# Patient Record
Sex: Male | Born: 1940 | Race: White | Hispanic: No | Marital: Married | Smoking: Never smoker
Health system: Southern US, Community
[De-identification: ages and names within clinical notes are randomized; demographics above are authoritative.]

## PROBLEM LIST (undated history)

## (undated) DIAGNOSIS — E785 Hyperlipidemia, unspecified: Secondary | ICD-10-CM

## (undated) DIAGNOSIS — I2510 Atherosclerotic heart disease of native coronary artery without angina pectoris: Secondary | ICD-10-CM

## (undated) DIAGNOSIS — I10 Essential (primary) hypertension: Secondary | ICD-10-CM

## (undated) DIAGNOSIS — Z7901 Long term (current) use of anticoagulants: Secondary | ICD-10-CM

## (undated) DIAGNOSIS — I4891 Unspecified atrial fibrillation: Secondary | ICD-10-CM

## (undated) DIAGNOSIS — E538 Deficiency of other specified B group vitamins: Secondary | ICD-10-CM

## (undated) HISTORY — PX: CORONARY ARTERY BYPASS GRAFT: SHX141

## (undated) HISTORY — PX: ATRIAL FIBRILLATION ABLATION: SHX5732

## (undated) HISTORY — PX: HERNIA REPAIR: SHX51

## (undated) HISTORY — PX: CARDIAC CATHETERIZATION: SHX172

## (undated) HISTORY — PX: CARDIAC SURGERY: SHX584

---

## 1999-10-02 DIAGNOSIS — Z951 Presence of aortocoronary bypass graft: Secondary | ICD-10-CM

## 1999-10-02 HISTORY — DX: Presence of aortocoronary bypass graft: Z95.1

## 2008-11-02 ENCOUNTER — Ambulatory Visit: Payer: Self-pay | Admitting: Surgery

## 2009-04-14 ENCOUNTER — Ambulatory Visit: Payer: Self-pay | Admitting: Unknown Physician Specialty

## 2016-12-16 ENCOUNTER — Emergency Department: Admission: EM | Admit: 2016-12-16 | Payer: Self-pay | Source: Home / Self Care

## 2017-09-06 DIAGNOSIS — I4891 Unspecified atrial fibrillation: Secondary | ICD-10-CM | POA: Insufficient documentation

## 2017-09-06 DIAGNOSIS — I10 Essential (primary) hypertension: Secondary | ICD-10-CM | POA: Insufficient documentation

## 2017-09-06 DIAGNOSIS — T50901A Poisoning by unspecified drugs, medicaments and biological substances, accidental (unintentional), initial encounter: Secondary | ICD-10-CM | POA: Diagnosis present

## 2017-09-06 DIAGNOSIS — T45511A Poisoning by anticoagulants, accidental (unintentional), initial encounter: Secondary | ICD-10-CM | POA: Insufficient documentation

## 2017-09-06 DIAGNOSIS — Z7901 Long term (current) use of anticoagulants: Secondary | ICD-10-CM | POA: Diagnosis not present

## 2017-09-06 DIAGNOSIS — Z951 Presence of aortocoronary bypass graft: Secondary | ICD-10-CM | POA: Diagnosis not present

## 2017-09-06 DIAGNOSIS — E785 Hyperlipidemia, unspecified: Secondary | ICD-10-CM | POA: Diagnosis not present

## 2017-09-06 DIAGNOSIS — T462X1A Poisoning by other antidysrhythmic drugs, accidental (unintentional), initial encounter: Principal | ICD-10-CM | POA: Insufficient documentation

## 2017-09-06 DIAGNOSIS — Z79899 Other long term (current) drug therapy: Secondary | ICD-10-CM | POA: Diagnosis not present

## 2017-09-06 DIAGNOSIS — I2510 Atherosclerotic heart disease of native coronary artery without angina pectoris: Secondary | ICD-10-CM | POA: Insufficient documentation

## 2017-09-06 DIAGNOSIS — I491 Atrial premature depolarization: Secondary | ICD-10-CM | POA: Insufficient documentation

## 2017-09-07 ENCOUNTER — Other Ambulatory Visit: Payer: Self-pay

## 2017-09-07 ENCOUNTER — Observation Stay
Admission: EM | Admit: 2017-09-07 | Discharge: 2017-09-07 | Disposition: A | Discharge status: Home / Self Care | Payer: Medicare Other | Attending: Internal Medicine | Admitting: Internal Medicine

## 2017-09-07 ENCOUNTER — Encounter: Payer: Self-pay | Admitting: Emergency Medicine

## 2017-09-07 DIAGNOSIS — T462X1A Poisoning by other antidysrhythmic drugs, accidental (unintentional), initial encounter: Secondary | ICD-10-CM | POA: Diagnosis not present

## 2017-09-07 DIAGNOSIS — T50901A Poisoning by unspecified drugs, medicaments and biological substances, accidental (unintentional), initial encounter: Secondary | ICD-10-CM | POA: Diagnosis present

## 2017-09-07 HISTORY — DX: Unspecified atrial fibrillation: I48.91

## 2017-09-07 LAB — CBC
HCT: 42 % (ref 40.0–52.0)
HEMATOCRIT: 38.2 % — AB (ref 40.0–52.0)
Hemoglobin: 13.1 g/dL (ref 13.0–18.0)
Hemoglobin: 14.4 g/dL (ref 13.0–18.0)
MCH: 32 pg (ref 26.0–34.0)
MCH: 32 pg (ref 26.0–34.0)
MCHC: 34.3 g/dL (ref 32.0–36.0)
MCHC: 34.4 g/dL (ref 32.0–36.0)
MCV: 92.8 fL (ref 80.0–100.0)
MCV: 93.4 fL (ref 80.0–100.0)
PLATELETS: 232 10*3/uL (ref 150–440)
PLATELETS: 243 10*3/uL (ref 150–440)
RBC: 4.11 MIL/uL — AB (ref 4.40–5.90)
RBC: 4.5 MIL/uL (ref 4.40–5.90)
RDW: 14 % (ref 11.5–14.5)
RDW: 14.1 % (ref 11.5–14.5)
WBC: 6.8 10*3/uL (ref 3.8–10.6)
WBC: 7 10*3/uL (ref 3.8–10.6)

## 2017-09-07 LAB — COMPREHENSIVE METABOLIC PANEL
ALBUMIN: 4.2 g/dL (ref 3.5–5.0)
ALT: 38 U/L (ref 17–63)
ANION GAP: 10 (ref 5–15)
AST: 40 U/L (ref 15–41)
Alkaline Phosphatase: 65 U/L (ref 38–126)
BUN: 21 mg/dL — AB (ref 6–20)
CHLORIDE: 103 mmol/L (ref 101–111)
CO2: 25 mmol/L (ref 22–32)
Calcium: 9.1 mg/dL (ref 8.9–10.3)
Creatinine, Ser: 0.95 mg/dL (ref 0.61–1.24)
GFR calc Af Amer: >60 mL/min (ref >60)
Glucose, Bld: 136 mg/dL — ABNORMAL HIGH (ref 65–99)
POTASSIUM: 3.5 mmol/L (ref 3.5–5.1)
Sodium: 138 mmol/L (ref 135–145)
Total Bilirubin: 0.8 mg/dL (ref 0.3–1.2)
Total Protein: 7.7 g/dL (ref 6.5–8.1)

## 2017-09-07 LAB — MAGNESIUM: MAGNESIUM: 2.1 mg/dL (ref 1.7–2.4)

## 2017-09-07 LAB — BASIC METABOLIC PANEL
Anion gap: 9 (ref 5–15)
BUN: 18 mg/dL (ref 6–20)
CHLORIDE: 106 mmol/L (ref 101–111)
CO2: 23 mmol/L (ref 22–32)
Calcium: 8.4 mg/dL — ABNORMAL LOW (ref 8.9–10.3)
Creatinine, Ser: 0.8 mg/dL (ref 0.61–1.24)
GFR calc non Af Amer: >60 mL/min (ref >60)
Glucose, Bld: 103 mg/dL — ABNORMAL HIGH (ref 65–99)
Potassium: 4 mmol/L (ref 3.5–5.1)
SODIUM: 138 mmol/L (ref 135–145)

## 2017-09-07 LAB — TROPONIN I: Troponin I: <0.03 ng/mL (ref <0.03)

## 2017-09-07 MED ORDER — SODIUM CHLORIDE 0.9% FLUSH
3.0000 mL | Freq: Two times a day (BID) | INTRAVENOUS | Status: DC
  Administered 2017-09-07: 3 mL via INTRAVENOUS

## 2017-09-07 MED ORDER — ONDANSETRON HCL 4 MG/2ML IJ SOLN: 4.0000 mg | Freq: Four times a day (QID) | INTRAMUSCULAR | Status: DC | PRN

## 2017-09-07 MED ORDER — RAMIPRIL 10 MG PO CAPS
10.0000 mg | ORAL_CAPSULE | Freq: Every day | ORAL | Status: DC
  Filled 2017-09-07: qty 1

## 2017-09-07 MED ORDER — LORATADINE 10 MG PO TABS
10.0000 mg | ORAL_TABLET | Freq: Every day | ORAL | Status: DC
Start: 2017-09-07 — End: 2017-09-07
  Filled 2017-09-07: qty 1

## 2017-09-07 MED ORDER — SENNOSIDES-DOCUSATE SODIUM 8.6-50 MG PO TABS: 1.0000 | ORAL_TABLET | Freq: Every evening | ORAL | Status: DC | PRN

## 2017-09-07 MED ORDER — SODIUM CHLORIDE 0.9 % IV SOLN: 250.0000 mL | INTRAVENOUS | Status: DC | PRN

## 2017-09-07 MED ORDER — ACETAMINOPHEN 650 MG RE SUPP
650.0000 mg | Freq: Four times a day (QID) | RECTAL | Status: DC | PRN
Start: 2017-09-07 — End: 2017-09-07

## 2017-09-07 MED ORDER — VITAMIN B-12 1000 MCG PO TABS
1000.0000 ug | ORAL_TABLET | Freq: Every day | ORAL | Status: DC
  Administered 2017-09-07: 1000 ug via ORAL
  Filled 2017-09-07: qty 1

## 2017-09-07 MED ORDER — METOPROLOL SUCCINATE ER 100 MG PO TB24
100.0000 mg | ORAL_TABLET | Freq: Two times a day (BID) | ORAL | Status: DC
  Filled 2017-09-07: qty 1

## 2017-09-07 MED ORDER — ATORVASTATIN CALCIUM 20 MG PO TABS: 80.0000 mg | ORAL_TABLET | Freq: Every day | ORAL | Status: DC

## 2017-09-07 MED ORDER — APIXABAN 5 MG PO TABS: 5.0000 mg | ORAL_TABLET | Freq: Two times a day (BID) | ORAL | Status: DC

## 2017-09-07 MED ORDER — ONDANSETRON HCL 4 MG PO TABS: 4.0000 mg | ORAL_TABLET | Freq: Four times a day (QID) | ORAL | Status: DC | PRN

## 2017-09-07 MED ORDER — AMLODIPINE BESYLATE 5 MG PO TABS
5.0000 mg | ORAL_TABLET | Freq: Every day | ORAL | Status: DC
  Filled 2017-09-07: qty 1

## 2017-09-07 MED ORDER — ACETAMINOPHEN 325 MG PO TABS: 650.0000 mg | ORAL_TABLET | Freq: Four times a day (QID) | ORAL | Status: DC | PRN

## 2017-09-07 MED ORDER — SODIUM CHLORIDE 0.9% FLUSH: 3.0000 mL | INTRAVENOUS | Status: DC | PRN

## 2017-09-07 NOTE — ED Triage Notes (Signed)
Pt states took too much of his eliquis and dofetilide. Pt states has had 20mg  of eliquis and 1017mcg of dofetilide since 2100. Overdose was unintentional.

## 2017-09-07 NOTE — H&P (Signed)
[facility] [facility] [place] NAME: Kerry Graham    MR#:  [id number]  DATE OF BIRTH:  1941-03-04  DATE OF ADMISSION:  09/07/2017  PRIMARY CARE PHYSICIAN: Rusty Aus, MD   REQUESTING/REFERRING PHYSICIAN:   CHIEF COMPLAINT:   Chief Complaint  Patient presents with  . Ingestion    HISTORY OF PRESENT ILLNESS: Kerry Graham  is a 76 y.o. male with a known history of atrial fibrillation presented to the emergency room after he unintentionally took an extra dose of his heart medication. Patient took an extra pill of tikosyn and eliquis last night around 9 PM. After 15 minutes after taking the medication he tried to vomit but did not see anything come up. He looked up for information on the Internet and was concerned about the prolongation of QTC intervals and sudden cardiac death and hence decided to come to the hospital for evaluation. No complaints of any chest pain, palpitations. No complaints of any shortness of breath. Patient has an electrophysiologist at [facility] control was contacted by ER physician who recommended 12 hour observation has half life for tikosyn is 10 hours.   PAST MEDICAL HISTORY:   Past Medical History:  Diagnosis Date  . Atrial fibrillation ([place])   Hyperlipidemia Hypertension CAD  PAST SURGICAL HISTORY:  Past Surgical History:  Procedure Laterality Date  . CARDIAC SURGERY    . CORONARY ARTERY BYPASS GRAFT    . HERNIA REPAIR      SOCIAL HISTORY:  Social History   Tobacco Use  . Smoking status: Never Smoker  . Smokeless tobacco: Never Used  Substance Use Topics  . Alcohol use: No    Frequency: Never    FAMILY HISTORY: History reviewed. No pertinent family history.  DRUG ALLERGIES: No Known Allergies  REVIEW OF SYSTEMS:   CONSTITUTIONAL: No fever, fatigue or weakness.  EYES: No blurred or double vision.  EARS, NOSE, AND THROAT: No tinnitus or ear pain.  RESPIRATORY: No cough, shortness  of breath, wheezing or hemoptysis.  CARDIOVASCULAR: No chest pain, orthopnea, edema.  GASTROINTESTINAL: No nausea, vomiting, diarrhea or abdominal pain.  GENITOURINARY: No dysuria, hematuria.  ENDOCRINE: No polyuria, nocturia,  HEMATOLOGY: No anemia, easy bruising or bleeding SKIN: No rash or lesion. MUSCULOSKELETAL: No joint pain or arthritis.   NEUROLOGIC: No tingling, numbness, weakness.  PSYCHIATRY: No anxiety or depression.   MEDICATIONS AT HOME:  Prior to Admission medications   Medication Sig Start Date End Date Taking? Authorizing Provider  amLODipine (NORVASC) 5 MG tablet Take 5 mg by mouth daily. 09/03/17  Yes [provider]  atorvastatin (LIPITOR) 80 MG tablet Take 80 mg by mouth daily. 06/11/17  Yes [provider]  cetirizine (ZYRTEC) 10 MG tablet Take 10 mg by mouth daily.   Yes [provider]  diltiazem (CARDIZEM) 30 MG tablet Take 30 mg by mouth every 6 (six) hours as needed. 08/11/17 08/11/18 Yes [provider]  dofetilide (TIKOSYN) 500 MCG capsule Take 500 mcg by mouth 2 (two) times daily. 08/12/17  Yes [provider]  ELIQUIS 5 MG TABS tablet Take 5 mg by mouth 2 (two) times daily. 06/12/17  Yes [provider]  meclizine (ANTIVERT) 25 MG tablet Take 25 mg by mouth 3 (three) times daily as needed.   Yes [provider]  metoprolol succinate (TOPROL-XL) 100 MG 24 hr tablet Take 100 mg by mouth 2 (two) times daily. 07/11/17  Yes [provider]  ramipril ([place])  10 MG capsule Take 10 mg by mouth daily. 06/11/17  Yes [provider]  vitamin B-12 (CYANOCOBALAMIN) 1000 MCG tablet Take 1,000 mcg by mouth daily.   Yes [provider]      PHYSICAL EXAMINATION:   VITAL SIGNS: Blood pressure 112/64, pulse (!) 56, temperature 98.1 F (36.7 C), temperature source Oral, resp. rate 15, height 5\' 7"  (1.702 m), weight 78 kg (172 lb), SpO2 95 %.  GENERAL:  77 y.o.-year-old patient lying in  the bed with no acute distress.  EYES: Pupils equal, round, reactive to light and accommodation. No scleral icterus. Extraocular muscles intact.  HEENT: Head atraumatic, normocephalic. Oropharynx and nasopharynx clear.  NECK:  Supple, no jugular venous distention. No thyroid enlargement, no tenderness.  LUNGS: Normal breath sounds bilaterally, no wheezing, rales,rhonchi or crepitation. No use of accessory muscles of respiration.  CARDIOVASCULAR: S1, S2 irregular. No murmurs, rubs, or gallops.  ABDOMEN: Soft, nontender, nondistended. Bowel sounds present. No organomegaly or mass.  EXTREMITIES: No pedal edema, cyanosis, or clubbing.  NEUROLOGIC: Cranial nerves II through XII are intact. Muscle strength 5/5 in all extremities. Sensation intact. Gait not checked.  PSYCHIATRIC: The patient is alert and oriented x 3.  SKIN: No obvious rash, lesion, or ulcer.   LABORATORY PANEL:   CBC Recent Labs  Lab 09/07/17 0021  WBC 7.0  HGB 14.4  HCT 42.0  PLT 243  MCV 93.4  MCH 32.0  MCHC 34.3  RDW 14.1   ------------------------------------------------------------------------------------------------------------------  Chemistries  Recent Labs  Lab 09/07/17 0021  NA 138  K 3.5  CL 103  CO2 25  GLUCOSE 136*  BUN 21*  CREATININE 0.95  CALCIUM 9.1  MG 2.1  AST 40  ALT 38  ALKPHOS 65  BILITOT 0.8   ------------------------------------------------------------------------------------------------------------------ estimated creatinine clearance is 62.8 mL/min (by C-G formula based on SCr of 0.95 mg/dL). ------------------------------------------------------------------------------------------------------------------ No results for input(s): TSH, T4TOTAL, T3FREE, THYROIDAB in the last 72 hours.  Invalid input(s): FREET3   Coagulation profile No results for input(s): INR, PROTIME in the last 168  hours. ------------------------------------------------------------------------------------------------------------------- No results for input(s): DDIMER in the last 72 hours. -------------------------------------------------------------------------------------------------------------------  Cardiac Enzymes Recent Labs  Lab 09/07/17 0021  TROPONINI <0.03   ------------------------------------------------------------------------------------------------------------------ Invalid input(s): POCBNP  ---------------------------------------------------------------------------------------------------------------  Urinalysis No results found for: COLORURINE, APPEARANCEUR, LABSPEC, PHURINE, GLUCOSEU, HGBUR, BILIRUBINUR, KETONESUR, PROTEINUR, UROBILINOGEN, NITRITE, LEUKOCYTESUR   RADIOLOGY: No results found.  EKG: Orders placed or performed during the hospital encounter of 09/07/17  . EKG 12-Lead  . EKG 12-Lead    IMPRESSION AND PLAN: 76 year old male patient with history of atrial fibrillation presented to the emergency room after an intentional overdose with dofetilide and eliquis  Admitting diagnosis 1. Accidental overdose with dofetilide 2. Atrial fibrillation 3. Hyperlipidemia 4. Hypertension Treatment plan Admit patient to telemetry observation bed Hold dofetilide Restart eliquis tomorrow  Follow-up electrolytes Telemetry monitoring      All the records are reviewed and case discussed with ED provider. Management plans discussed with the patient, family and they are in agreement.  CODE STATUS:FULL CODE Code Status History    This patient does not have a recorded code status. Please follow your organizational policy for patients in this situation.       TOTAL TIME TAKING CARE OF THIS PATIENT: 50 minutes.    Saundra Shelling M.D on 09/07/2017 at 2:14 AM  Between 7am to 6pm - Pager - [phone]  After 6pm go to [email or web address] - password [employer] [facility]  Office  [phone]  CC: Primary care physician; Rusty Aus, MD

## 2017-09-07 NOTE — Discharge Planning (Signed)
Discharge instructions reviewed with pt. Pt verbalizes understanding. Pt ready for discharge home. 

## 2017-09-07 NOTE — ED Notes (Signed)
ED Provider at bedside. 

## 2017-09-07 NOTE — Discharge Instructions (Signed)
Resume diet and activity as before ° ° °

## 2017-09-07 NOTE — ED Notes (Signed)
Spoke with poison control who recommends continous ekg monitoring, cmp, mg2+ and ekg again in 4 hours. First nurse notified.

## 2017-09-07 NOTE — ED Provider Notes (Signed)
[facility] Emergency Department Provider Note   ____________________________________________   First MD Initiated Contact with Patient 09/07/17 0023     (approximate)  I have reviewed the triage vital signs and the nursing notes.   HISTORY  Chief Complaint Ingestion    HPI Kerry Graham is a 76 y.o. male who comes into the hospital today with an accidental overdose on his heart medication.  The patient states that he took a double dose of his dofetilide and Eliquis.  The patient states that he took a dose at 9 PM and then took another dose around 11 before he went to bed.  He states that he just was not thinking.  He reports that 15 minutes after taking the medicine he tried to vomit but did not see anything come up.  The patient reports that he looked up information on the Internet and was concerned about the prolonged QTC as well as sudden death so he decided to come into the hospital for evaluation.  The patient states that he was just started on this medication in November.  His electrophysiologist is at [facility] and is Dr. Norm Salt.  He reports that currently he feels well but he is here for evaluation.  Past Medical History:  Diagnosis Date  . Atrial fibrillation ([place])     There are no active problems to display for this patient.   Past Surgical History:  Procedure Laterality Date  . CARDIAC SURGERY    . CORONARY ARTERY BYPASS GRAFT    . HERNIA REPAIR      Prior to Admission medications   Not on File    Allergies Patient has no known allergies.  History reviewed. No pertinent family history.  Social History Social History   Tobacco Use  . Smoking status: Never Smoker  . Smokeless tobacco: Never Used  Substance Use Topics  . Alcohol use: No    Frequency: Never  . Drug use: Not on file    Review of Systems  Constitutional: No fever/chills Eyes: No visual changes. ENT: No sore throat. Cardiovascular: Denies chest  pain. Respiratory: Denies shortness of breath. Gastrointestinal: No abdominal pain.  No nausea, no vomiting.  No diarrhea.  No constipation. Genitourinary: Negative for dysuria. Musculoskeletal: Negative for back pain. Skin: Negative for rash. Neurological: Negative for headaches, focal weakness or numbness.   ____________________________________________   PHYSICAL EXAM:  VITAL SIGNS: ED Triage Vitals  Enc Vitals Group     BP 09/07/17 0003 (!) 189/80     Pulse Rate 09/07/17 0003 71     Resp 09/07/17 0003 16     Temp 09/07/17 0003 98.1 F (36.7 C)     Temp Source 09/07/17 0003 Oral     SpO2 09/07/17 0003 98 %     Weight 09/07/17 0005 172 lb (78 kg)     Height 09/07/17 0005 5\' 7"  (1.702 m)     Head Circumference --      Peak Flow --      Pain Score --      Pain Loc --      Pain Edu? --      Excl. in [place]? --     Constitutional: Alert and oriented. Well appearing and in no distress. Eyes: Conjunctivae are normal. PERRL. EOMI. Head: Atraumatic. Nose: No congestion/rhinnorhea. Mouth/Throat: Mucous membranes are moist.  Oropharynx non-erythematous. Cardiovascular: Normal rate, regular rhythm. Grossly normal heart sounds.  Good peripheral circulation. Respiratory: Normal respiratory effort.  No retractions. Lungs CTAB.  Gastrointestinal: Soft and nontender. No distention. Positive bowel sounds Musculoskeletal: No lower extremity tenderness nor edema.   Neurologic:  Normal speech and language.  Skin:  Skin is warm, dry and intact.  Psychiatric: Mood and affect are normal.   ____________________________________________   LABS (all labs ordered are listed, but only abnormal results are displayed)  Labs Reviewed  COMPREHENSIVE METABOLIC PANEL - Abnormal; Notable for the following components:      Result Value   Glucose, Bld 136 (*)    BUN 21 (*)    All other components within normal limits  CBC  MAGNESIUM  TROPONIN I    ____________________________________________  EKG  ED ECG REPORT I, Loney Hering, the attending physician, personally viewed and interpreted this ECG.   Date: 09/06/2017  EKG Time: 2357  Rate: 69  Rhythm: normal sinus rhythm  Axis: normal axis  Intervals:none  ST&T Change: Flipped T waves in leads II, III and aVF as well as V5 and V6.  No ST segment elevation noted.  ____________________________________________  RADIOLOGY  No results found.  ____________________________________________   PROCEDURES  Procedure(s) performed: None  Procedures  Critical Care performed: No  ____________________________________________   INITIAL IMPRESSION / ASSESSMENT AND PLAN / ED COURSE  As part of my medical decision making, I reviewed the following data within the electronic MEDICAL RECORD NUMBER Notes from prior ED visits and [place] Controlled Substance Database   This is a 76 year old male who comes into the hospital today after overdosing on his antiarrhythmic medication.  The patient is not having any symptoms but he was concerned about abnormal rhythms and sudden death.  We did contact poison control and they recommended a 12-hour observation given that the half-life of dofetilide is 10 hours.  They also recommended a CMP and a magnesium level.  We will check those labs and likely admit the patient for his observation.     She has blood work is unremarkable.  Again I will admit the patient to the hospitalist service and for the 12-hour observation. ____________________________________________   FINAL CLINICAL IMPRESSION(S) / ED DIAGNOSES  Final diagnoses:  Medication overdose, accidental or unintentional, initial encounter     ED Discharge Orders    None       Note:  This document was prepared using Dragon voice recognition software and may include unintentional dictation errors.    Loney Hering, MD 09/07/17 [phone]

## 2017-09-16 NOTE — Discharge Summary (Signed)
[place] at [place] NAME: Kerry Graham    MR#:  [id number]  DATE OF BIRTH:  [birth year]  DATE OF ADMISSION:  09/07/2017 ADMITTING PHYSICIAN: Saundra Shelling, MD  DATE OF DISCHARGE: 09/07/2017 11:46 AM  PRIMARY CARE PHYSICIAN: Rusty Aus, MD   ADMISSION DIAGNOSIS:  Medication overdose, accidental or unintentional, initial encounter [T50.901A]  DISCHARGE DIAGNOSIS:  Active Problems:   Overdose   SECONDARY DIAGNOSIS:   Past Medical History:  Diagnosis Date  . Atrial fibrillation ([place])      ADMITTING HISTORY  HISTORY OF PRESENT ILLNESS: Kerry Graham  is a 76 y.o. male with a known history of atrial fibrillation presented to the emergency room after he unintentionally took an extra dose of his heart medication. Patient took an extra pill of tikosyn and eliquis last night around 9 PM. After 15 minutes after taking the medication he tried to vomit but did not see anything come up. He looked up for information on the Internet and was concerned about the prolongation of QTC intervals and sudden cardiac death and hence decided to come to the hospital for evaluation. No complaints of any chest pain, palpitations. No complaints of any shortness of breath. Patient has an electrophysiologist at [facility] control was contacted by ER physician who recommended 12 hour observation has half life for tikosyn is 10 hours.     HOSPITAL COURSE:   *Atrial fibrillation with Tikosyn overdose.  Unintentional.  Patient was monitored overnight.  No arrhythmias on telemetry.  His Eliquis was held in the morning due to overdose.  No bleeding found.  Patient is being discharged home in stable condition to resume his home medications from today evening.  Follow-up with primary care physician in 1 week.  CONSULTS OBTAINED:    DRUG ALLERGIES:  No Known Allergies  DISCHARGE MEDICATIONS:   Allergies as of 09/07/2017   No Known Allergies      Medication List    TAKE these medications   amLODipine 5 MG tablet Commonly known as:  NORVASC Take 5 mg by mouth daily.   atorvastatin 80 MG tablet Commonly known as:  LIPITOR Take 80 mg by mouth daily.   cetirizine 10 MG tablet Commonly known as:  ZYRTEC Take 10 mg by mouth daily.   diltiazem 30 MG tablet Commonly known as:  CARDIZEM Take 30 mg by mouth every 6 (six) hours as needed.   dofetilide 500 MCG capsule Commonly known as:  TIKOSYN Take 500 mcg by mouth 2 (two) times daily.   ELIQUIS 5 MG Tabs tablet Generic drug:  apixaban Take 5 mg by mouth 2 (two) times daily.   meclizine 25 MG tablet Commonly known as:  ANTIVERT Take 25 mg by mouth 3 (three) times daily as needed.   metoprolol succinate 100 MG 24 hr tablet Commonly known as:  TOPROL-XL Take 100 mg by mouth 2 (two) times daily.   ramipril 10 MG capsule Commonly known as:  ALTACE Take 10 mg by mouth daily.   vitamin B-12 1000 MCG tablet Commonly known as:  CYANOCOBALAMIN Take 1,000 mcg by mouth daily.       Today   VITAL SIGNS:  Blood pressure (!) 125/59, pulse 61, temperature 97.9 F (36.6 C), temperature source Oral, resp. rate 18, height 5\' 7"  (1.702 m), weight 78 kg (172 lb), SpO2 100 %.  I/O:  No intake or output data in the 24 hours ending 09/16/17 1607  PHYSICAL EXAMINATION:  Physical Exam  GENERAL:  76 y.o.-year-old patient lying in the bed with no acute distress.  LUNGS: Normal breath sounds bilaterally, no wheezing, rales,rhonchi or crepitation. No use of accessory muscles of respiration.  CARDIOVASCULAR: S1, S2 normal. No murmurs, rubs, or gallops.  ABDOMEN: Soft, non-tender, non-distended. Bowel sounds present. No organomegaly or mass.  NEUROLOGIC: Moves all 4 extremities. PSYCHIATRIC: The patient is alert and oriented x 3.  SKIN: No obvious rash, lesion, or ulcer.   DATA REVIEW:   CBC No results for input(s): WBC, HGB, HCT, PLT in the last 168 hours.  Chemistries  No  results for input(s): NA, K, CL, CO2, GLUCOSE, BUN, CREATININE, CALCIUM, MG, AST, ALT, ALKPHOS, BILITOT in the last 168 hours.  Invalid input(s): GFRCGP  Cardiac Enzymes No results for input(s): TROPONINI in the last 168 hours.  Microbiology Results  No results found for this or any previous visit.  RADIOLOGY:  No results found.  Follow up with PCP in 1 week.  Management plans discussed with the patient, family and they are in agreement.  CODE STATUS:  Code Status History    Date Active Date Inactive Code Status Order ID Comments User Context   09/07/2017 04:02 09/07/2017 14:47 Full Code [id number]  Saundra Shelling, MD Inpatient      TOTAL TIME TAKING CARE OF THIS PATIENT ON DAY OF DISCHARGE: more than 30 minutes.   Leia Alf Jameel Quant M.D on 09/16/2017 at 4:07 PM  Between 7am to 6pm - Pager - [phone]  After 6pm go to [email or web address] - password [email or web address] [place] [facility]  Office  [phone]  CC: Primary care physician; Rusty Aus, MD  Note: This dictation was prepared with Dragon dictation along with smaller phrase technology. Any transcriptional errors that result from this process are unintentional.

## 2019-11-16 ENCOUNTER — Other Ambulatory Visit: Payer: Self-pay

## 2019-11-16 ENCOUNTER — Other Ambulatory Visit
Admission: RE | Admit: 2019-11-16 | Discharge: 2019-11-16 | Disposition: A | Discharge status: Home / Self Care | Payer: Medicare Other | Source: Ambulatory Visit | Attending: Internal Medicine | Admitting: Internal Medicine

## 2019-11-16 DIAGNOSIS — Z20822 Contact with and (suspected) exposure to covid-19: Secondary | ICD-10-CM | POA: Insufficient documentation

## 2019-11-16 DIAGNOSIS — Z01812 Encounter for preprocedural laboratory examination: Secondary | ICD-10-CM | POA: Diagnosis present

## 2019-11-17 ENCOUNTER — Encounter: Payer: Self-pay | Admitting: Internal Medicine

## 2019-11-17 LAB — SARS CORONAVIRUS 2 (TAT 6-24 HRS): SARS Coronavirus 2: NEGATIVE

## 2019-11-18 ENCOUNTER — Encounter
Admission: RE | Disposition: A | Discharge status: Home / Self Care | Payer: Self-pay | Source: Home / Self Care | Attending: Internal Medicine

## 2019-11-18 ENCOUNTER — Other Ambulatory Visit: Payer: Self-pay

## 2019-11-18 ENCOUNTER — Ambulatory Visit
Admission: RE | Admit: 2019-11-18 | Discharge: 2019-11-18 | Disposition: A | Discharge status: Home / Self Care | Payer: Medicare Other | Attending: Internal Medicine | Admitting: Internal Medicine

## 2019-11-18 ENCOUNTER — Ambulatory Visit: Admit: 2019-11-18 | Payer: Medicare Other | Admitting: Internal Medicine

## 2019-11-18 ENCOUNTER — Ambulatory Visit: Payer: Medicare Other | Admitting: Certified Registered"

## 2019-11-18 DIAGNOSIS — K640 First degree hemorrhoids: Secondary | ICD-10-CM | POA: Diagnosis not present

## 2019-11-18 DIAGNOSIS — Z1211 Encounter for screening for malignant neoplasm of colon: Secondary | ICD-10-CM | POA: Insufficient documentation

## 2019-11-18 DIAGNOSIS — I10 Essential (primary) hypertension: Secondary | ICD-10-CM | POA: Diagnosis not present

## 2019-11-18 DIAGNOSIS — K5730 Diverticulosis of large intestine without perforation or abscess without bleeding: Secondary | ICD-10-CM | POA: Insufficient documentation

## 2019-11-18 DIAGNOSIS — I4891 Unspecified atrial fibrillation: Secondary | ICD-10-CM | POA: Insufficient documentation

## 2019-11-18 DIAGNOSIS — Z86010 Personal history of colonic polyps: Secondary | ICD-10-CM | POA: Insufficient documentation

## 2019-11-18 DIAGNOSIS — D120 Benign neoplasm of cecum: Secondary | ICD-10-CM | POA: Insufficient documentation

## 2019-11-18 DIAGNOSIS — D122 Benign neoplasm of ascending colon: Secondary | ICD-10-CM | POA: Diagnosis not present

## 2019-11-18 DIAGNOSIS — E538 Deficiency of other specified B group vitamins: Secondary | ICD-10-CM | POA: Insufficient documentation

## 2019-11-18 DIAGNOSIS — I2510 Atherosclerotic heart disease of native coronary artery without angina pectoris: Secondary | ICD-10-CM | POA: Diagnosis not present

## 2019-11-18 DIAGNOSIS — E785 Hyperlipidemia, unspecified: Secondary | ICD-10-CM | POA: Insufficient documentation

## 2019-11-18 DIAGNOSIS — Z79899 Other long term (current) drug therapy: Secondary | ICD-10-CM | POA: Diagnosis not present

## 2019-11-18 DIAGNOSIS — Z7901 Long term (current) use of anticoagulants: Secondary | ICD-10-CM | POA: Insufficient documentation

## 2019-11-18 DIAGNOSIS — Z951 Presence of aortocoronary bypass graft: Secondary | ICD-10-CM | POA: Diagnosis not present

## 2019-11-18 HISTORY — DX: Essential (primary) hypertension: I10

## 2019-11-18 HISTORY — DX: Hyperlipidemia, unspecified: E78.5

## 2019-11-18 HISTORY — DX: Deficiency of other specified B group vitamins: E53.8

## 2019-11-18 HISTORY — PX: COLONOSCOPY WITH PROPOFOL: SHX5780

## 2019-11-18 HISTORY — DX: Atherosclerotic heart disease of native coronary artery without angina pectoris: I25.10

## 2019-11-18 SURGERY — COLONOSCOPY WITH PROPOFOL
Anesthesia: General

## 2019-11-18 MED ORDER — PROPOFOL 500 MG/50ML IV EMUL
INTRAVENOUS | Status: DC | PRN
  Administered 2019-11-18: 140 ug/kg/min via INTRAVENOUS

## 2019-11-18 MED ORDER — PHENYLEPHRINE HCL (PRESSORS) 10 MG/ML IV SOLN
INTRAVENOUS | Status: DC | PRN
  Administered 2019-11-18: 100 ug via INTRAVENOUS

## 2019-11-18 MED ORDER — SODIUM CHLORIDE 0.9 % IV SOLN
INTRAVENOUS | Status: DC
  Administered 2019-11-18: 1000 mL via INTRAVENOUS

## 2019-11-18 MED ORDER — PROPOFOL 500 MG/50ML IV EMUL
INTRAVENOUS | Status: AC
  Filled 2019-11-18: qty 50

## 2019-11-18 NOTE — Interval H&P Note (Signed)
History and Physical Interval Note:  11/18/2019 9:31 AM  Kerry Graham  has presented today for surgery, with the diagnosis of PERSONAL HX.OF COLON POLYPS.  The various methods of treatment have been discussed with the patient and family. After consideration of risks, benefits and other options for treatment, the patient has consented to  Procedure(s) with comments: COLONOSCOPY WITH PROPOFOL (N/A) - PATIENT ON ELIQUIS as a surgical intervention.  The patient's history has been reviewed, patient examined, no change in status, stable for surgery.  I have reviewed the patient's chart and labs.  Questions were answered to the patient's satisfaction.     [place], [place]

## 2019-11-18 NOTE — Transfer of Care (Signed)
Immediate Anesthesia Transfer of Care Note  Patient: Kerry Graham  Procedure(s) Performed: COLONOSCOPY WITH PROPOFOL (N/A )  Patient Location: PACU  Anesthesia Type:General  Level of Consciousness: awake, alert  and oriented  Airway & Oxygen Therapy: Patient Spontanous Breathing and Patient connected to nasal cannula oxygen  Post-op Assessment: Report given to RN and Post -op Vital signs reviewed and stable  Post vital signs: Reviewed and stable  Last Vitals:  Vitals Value Taken Time  BP 104/60 11/18/19 1027  Temp 36 C 11/18/19 1020  Pulse 72 11/18/19 1029  Resp 19 11/18/19 1029  SpO2 100 % 11/18/19 1029  Vitals shown include unvalidated device data.  Last Pain:  Vitals:   11/18/19 1020  TempSrc: Temporal  PainSc: Asleep         Complications: No apparent anesthesia complications

## 2019-11-18 NOTE — Anesthesia Preprocedure Evaluation (Addendum)
Anesthesia Evaluation  Patient identified by MRN, date of birth, ID band Patient awake    Reviewed: Allergy & Precautions, H&P , NPO status , Patient's Chart, lab work & pertinent test results  Airway Mallampati: II  TM Distance: <3 FB Neck ROM: full    Dental  (+) Chipped   Pulmonary neg pulmonary ROS, neg shortness of breath, neg COPD, Not current smoker,           Cardiovascular hypertension, (-) angina+ CAD and + CABG  + dysrhythmias (AFib s/p ablation) Atrial Fibrillation      Neuro/Psych negative neurological ROS  negative psych ROS   GI/Hepatic negative GI ROS, Neg liver ROS,   Endo/Other  negative endocrine ROS  Renal/GU negative Renal ROS  negative genitourinary   Musculoskeletal   Abdominal   Peds  Hematology negative hematology ROS (+)   Anesthesia Other Findings Past Medical History: No date: Atrial fibrillation ([facility]) No date: Coronary artery disease No date: Hyperlipidemia No date: Hypertension No date: Vitamin B 12 deficiency  Past Surgical History: No date: CARDIAC CATHETERIZATION No date: CARDIAC SURGERY No date: CORONARY ARTERY BYPASS GRAFT No date: HERNIA REPAIR  BMI    Body Mass Index: 25.85 kg/m      Reproductive/Obstetrics negative OB ROS                            Anesthesia Physical Anesthesia Plan  ASA: II  Anesthesia Plan: General   Post-op Pain Management:    Induction:   PONV Risk Score and Plan: Propofol infusion and TIVA  Airway Management Planned: Natural Airway and Nasal Cannula  Additional Equipment:   Intra-op Plan:   Post-operative Plan:   Informed Consent: I have reviewed the patients History and Physical, chart, labs and discussed the procedure including the risks, benefits and alternatives for the proposed anesthesia with the patient or authorized representative who has indicated his/her understanding and acceptance.      Dental Advisory Given  Plan Discussed with: Anesthesiologist, CRNA and Surgeon  Anesthesia Plan Comments:         Anesthesia Quick Evaluation

## 2019-11-18 NOTE — Op Note (Signed)
[facility] Gastroenterology Patient Name: Kerry Graham Procedure Date: 11/18/2019 9:49 AM MRN: [id number] Account #: [id number] Date of Birth: [birth year] Admit Type: Outpatient Age: 79 Room: [facility] ENDO ROOM 3 Gender: Male Note Status: Finalized Procedure:             Colonoscopy Indications:           High risk colon cancer surveillance: Personal history                         of colonic polyps Providers:             Benay Pike. Alice Reichert MD, MD Referring MD:          Rusty Aus, MD (Referring MD) Medicines:             Propofol per Anesthesia Complications:         No immediate complications. Procedure:             Pre-Anesthesia Assessment:                        - The risks and benefits of the procedure and the                         sedation options and risks were discussed with the                         patient. All questions were answered and informed                         consent was obtained.                        - Patient identification and proposed procedure were                         verified prior to the procedure by the nurse. The                         procedure was verified in the procedure room.                        - ASA Grade Assessment: III - A patient with severe                         systemic disease.                        - After reviewing the risks and benefits, the patient                         was deemed in satisfactory condition to undergo the                         procedure.                        After obtaining informed consent, the colonoscope was                         passed under direct vision. Throughout the  procedure,                         the patient's blood pressure, pulse, and oxygen                         saturations were monitored continuously. The                         Colonoscope was introduced through the anus and                         advanced to the the cecum, identified by appendiceal                          orifice and ileocecal valve. The colonoscopy was                         performed without difficulty. The patient tolerated                         the procedure well. The quality of the bowel                         preparation was adequate. The ileocecal valve,                         appendiceal orifice, and rectum were photographed. Findings:      The perianal and digital rectal examinations were normal. Pertinent       negatives include normal sphincter tone, no palpable rectal lesions and       normal prostate (size, shape, and consistency).      Many small-mouthed diverticula were found in the sigmoid colon.      A 8 mm polyp was found in the cecum. The polyp was sessile. The polyp       was removed with a cold snare. Resection and retrieval were complete. To       prevent bleeding after the polypectomy, one hemostatic clip was       successfully placed (MR conditional). There was no bleeding at the end       of the procedure.      A 4 mm polyp was found in the proximal ascending colon. The polyp was       sessile. The polyp was removed with a jumbo cold forceps. Resection and       retrieval were complete.      Non-bleeding internal hemorrhoids were found during retroflexion. The       hemorrhoids were Grade I (internal hemorrhoids that do not prolapse).      The exam was otherwise without abnormality. Impression:            - Diverticulosis in the sigmoid colon.                        - One 8 mm polyp in the cecum, removed with a cold                         snare. Resected and retrieved. Clip (MR conditional)  was placed.                        - One 4 mm polyp in the proximal ascending colon,                         removed with a jumbo cold forceps. Resected and                         retrieved.                        - Non-bleeding internal hemorrhoids.                        - The examination was otherwise  normal. Recommendation:        - Patient has a contact number available for                         emergencies. The signs and symptoms of potential                         delayed complications were discussed with the patient.                         Return to normal activities tomorrow. Written                         discharge instructions were provided to the patient.                        - Resume previous diet.                        - Continue present medications.                        - Resume Eliquis (apixaban) at prior dose today. Refer                         to managing physician for further adjustment of                         therapy.                        - Await pathology results.                        - If polyps are benign or adenomatous without                         dysplasia, I will advise NO further colonoscopy due to                         advanced age and/or severe comorbidity.                        - The findings and recommendations were discussed with  the patient.                        - Return to GI office PRN. Procedure Code(s):     --- Professional ---                        [phone], Colonoscopy, flexible; with removal of                         tumor(s), polyp(s), or other lesion(s) by snare                         technique                        45380, 50, Colonoscopy, flexible; with biopsy, single                         or multiple Diagnosis Code(s):     --- Professional ---                        K57.30, Diverticulosis of large intestine without                         perforation or abscess without bleeding                        K63.5, Polyp of colon                        K64.0, First degree hemorrhoids                        Z86.010, Personal history of colonic polyps CPT copyright 2019 [employer]. All rights reserved. The codes documented in this report are preliminary and upon coder review may   be revised to meet current compliance requirements. Efrain Sella MD, MD 11/18/2019 10:20:44 AM This report has been signed electronically. Number of Addenda: 0 Note Initiated On: 11/18/2019 9:49 AM Scope Withdrawal Time: 0 hours 8 minutes 52 seconds  Total Procedure Duration: 0 hours 12 minutes 41 seconds  Estimated Blood Loss:  Estimated blood loss was minimal.      [facility]

## 2019-11-18 NOTE — Interval H&P Note (Signed)
History and Physical Interval Note:  11/18/2019 9:47 AM  Kerry Graham  has presented today for surgery, with the diagnosis of PERSONAL HX.OF COLON POLYPS.  The various methods of treatment have been discussed with the patient and family. After consideration of risks, benefits and other options for treatment, the patient has consented to  Procedure(s) with comments: COLONOSCOPY WITH PROPOFOL (N/A) - PATIENT ON ELIQUIS as a surgical intervention.  The patient's history has been reviewed, patient examined, no change in status, stable for surgery.  I have reviewed the patient's chart and labs.  Questions were answered to the patient's satisfaction.     [place], [place]

## 2019-11-18 NOTE — H&P (Signed)
  Outpatient short stay form Pre-procedure 11/18/2019 9:28 AM Toryn Dewalt K. Alice Reichert, M.D.  Primary Physician: Emily Filbert, M.D.  Reason for visit: Personal hx of polyps.  History of present illness:                            Patient presents for colonoscopy for a personal hx of colon polyps. The patient denies abdominal pain, abnormal weight loss or rectal bleeding.      Current Facility-Administered Medications:  .  0.9 %  sodium chloride infusion, , Intravenous, Continuous, Dent Plantz, Benay Pike, MD  Medications Prior to Admission  Medication Sig Dispense Refill Last Dose  . amLODipine (NORVASC) 5 MG tablet Take 5 mg by mouth daily.   11/17/2019 at Unknown time  . atorvastatin (LIPITOR) 80 MG tablet Take 80 mg by mouth daily.   11/17/2019 at Unknown time  . cetirizine (ZYRTEC) 10 MG tablet Take 10 mg by mouth daily.   11/17/2019 at Unknown time  . ELIQUIS 5 MG TABS tablet Take 5 mg by mouth 2 (two) times daily.   Past Week at Unknown time  . meclizine (ANTIVERT) 25 MG tablet Take 25 mg by mouth 3 (three) times daily as needed.   Past Month at Unknown time  . metoprolol succinate (TOPROL-XL) 100 MG 24 hr tablet Take 100 mg by mouth 2 (two) times daily.   11/17/2019 at Unknown time  . ramipril (ALTACE) 10 MG capsule Take 10 mg by mouth daily.   11/17/2019 at Unknown time  . vitamin B-12 (CYANOCOBALAMIN) 1000 MCG tablet Take 1,000 mcg by mouth daily.   Past Week at Unknown time  . diltiazem (CARDIZEM) 30 MG tablet Take 30 mg by mouth every 6 (six) hours as needed.     . dofetilide (TIKOSYN) 500 MCG capsule Take 500 mcg by mouth 2 (two) times daily.   Not Taking at Unknown time     No Known Allergies   Past Medical History:  Diagnosis Date  . Atrial fibrillation ([place])   . Coronary artery disease   . Hyperlipidemia   . Hypertension   . Vitamin B 12 deficiency     Review of systems:  Otherwise negative.    Physical Exam  Gen: Alert, oriented. Appears stated age.  HEENT: [place]/AT.  PERRLA. Lungs: CTA, no wheezes. CV: RR nl S1, S2. Abd: soft, benign, no masses. BS+ Ext: No edema. Pulses 2+    Planned procedures: Proceed with colonoscopy. The patient understands the nature of the planned procedure, indications, risks, alternatives and potential complications including but not limited to bleeding, infection, perforation, damage to internal organs and possible oversedation/side effects from anesthesia. The patient agrees and gives consent to proceed.  Please refer to procedure notes for findings, recommendations and patient disposition/instructions.     Ceceilia Cephus K. Alice Reichert, M.D. Gastroenterology 11/18/2019  9:28 AM

## 2019-11-19 ENCOUNTER — Encounter: Payer: Self-pay | Admitting: *Deleted

## 2019-11-19 LAB — SURGICAL PATHOLOGY

## 2019-11-19 NOTE — Anesthesia Postprocedure Evaluation (Signed)
Anesthesia Post Note  Patient: Kerry Graham  Procedure(s) Performed: COLONOSCOPY WITH PROPOFOL (N/A )  Patient location during evaluation: PACU Anesthesia Type: General Level of consciousness: awake and alert Pain management: pain level controlled Vital Signs Assessment: post-procedure vital signs reviewed and stable Respiratory status: spontaneous breathing, nonlabored ventilation and respiratory function stable Cardiovascular status: blood pressure returned to baseline and stable Postop Assessment: no apparent nausea or vomiting Anesthetic complications: no     Last Vitals:  Vitals:   11/18/19 1027 11/18/19 1050  BP: 104/60 (!) 105/52  Pulse:    Resp:    Temp:    SpO2:      Last Pain:  Vitals:   11/18/19 1050  TempSrc:   PainSc: 0-No pain                 [facility]

## 2020-05-09 ENCOUNTER — Other Ambulatory Visit: Payer: Self-pay

## 2020-05-09 ENCOUNTER — Emergency Department
Admission: EM | Admit: 2020-05-09 | Discharge: 2020-05-09 | Disposition: A | Discharge status: Home / Self Care | Payer: Medicare Other | Attending: Emergency Medicine | Admitting: Emergency Medicine

## 2020-05-09 ENCOUNTER — Encounter: Payer: Self-pay | Admitting: Emergency Medicine

## 2020-05-09 DIAGNOSIS — Z5321 Procedure and treatment not carried out due to patient leaving prior to being seen by health care provider: Secondary | ICD-10-CM | POA: Diagnosis not present

## 2020-05-09 DIAGNOSIS — R55 Syncope and collapse: Secondary | ICD-10-CM | POA: Insufficient documentation

## 2020-05-09 DIAGNOSIS — Z7901 Long term (current) use of anticoagulants: Secondary | ICD-10-CM | POA: Insufficient documentation

## 2020-05-09 LAB — BASIC METABOLIC PANEL
Anion gap: 8 (ref 5–15)
BUN: 22 mg/dL (ref 8–23)
CO2: 26 mmol/L (ref 22–32)
Calcium: 8.6 mg/dL — ABNORMAL LOW (ref 8.9–10.3)
Chloride: 104 mmol/L (ref 98–111)
Creatinine, Ser: 1.28 mg/dL — ABNORMAL HIGH (ref 0.61–1.24)
GFR calc Af Amer: >60 mL/min (ref >60)
GFR calc non Af Amer: 54 mL/min — ABNORMAL LOW (ref >60)
Glucose, Bld: 121 mg/dL — ABNORMAL HIGH (ref 70–99)
Potassium: 4.7 mmol/L (ref 3.5–5.1)
Sodium: 138 mmol/L (ref 135–145)

## 2020-05-09 LAB — URINALYSIS, COMPLETE (UACMP) WITH MICROSCOPIC
Bacteria, UA: NONE SEEN
Bilirubin Urine: NEGATIVE
Glucose, UA: NEGATIVE mg/dL
Hgb urine dipstick: NEGATIVE
Ketones, ur: NEGATIVE mg/dL
Nitrite: NEGATIVE
Protein, ur: NEGATIVE mg/dL
Specific Gravity, Urine: 1.019 (ref 1.005–1.030)
pH: 5 (ref 5.0–8.0)

## 2020-05-09 LAB — CBC
HCT: 38.9 % — ABNORMAL LOW (ref 39.0–52.0)
Hemoglobin: 13.5 g/dL (ref 13.0–17.0)
MCH: 32.3 pg (ref 26.0–34.0)
MCHC: 34.7 g/dL (ref 30.0–36.0)
MCV: 93.1 fL (ref 80.0–100.0)
Platelets: 213 10*3/uL (ref 150–400)
RBC: 4.18 MIL/uL — ABNORMAL LOW (ref 4.22–5.81)
RDW: 13.7 % (ref 11.5–15.5)
WBC: 11.4 10*3/uL — ABNORMAL HIGH (ref 4.0–10.5)
nRBC: 0 % (ref 0.0–0.2)

## 2020-05-09 NOTE — ED Triage Notes (Signed)
Was outside for few hours.  Had syncopal episode and bumped back of head.  On eloquis.  bp 80/42 and after 250 bolus ns 100/84.  12 lead nsr 64-fsbs 115.  History a fib and heart surgery

## 2020-10-03 ENCOUNTER — Other Ambulatory Visit: Payer: Self-pay | Admitting: Orthopedic Surgery

## 2020-10-03 DIAGNOSIS — M25561 Pain in right knee: Secondary | ICD-10-CM

## 2020-10-12 ENCOUNTER — Ambulatory Visit
Admission: RE | Admit: 2020-10-12 | Discharge: 2020-10-12 | Disposition: A | Discharge status: Home / Self Care | Payer: Medicare Other | Source: Ambulatory Visit | Attending: Orthopedic Surgery | Admitting: Orthopedic Surgery

## 2020-10-12 ENCOUNTER — Other Ambulatory Visit: Payer: Self-pay

## 2020-10-12 DIAGNOSIS — M25561 Pain in right knee: Secondary | ICD-10-CM | POA: Diagnosis not present

## 2020-11-28 ENCOUNTER — Other Ambulatory Visit: Payer: Self-pay | Admitting: Orthopedic Surgery

## 2020-12-06 ENCOUNTER — Encounter
Admission: RE | Admit: 2020-12-06 | Discharge: 2020-12-06 | Disposition: A | Discharge status: Home / Self Care | Payer: Medicare Other | Source: Ambulatory Visit | Attending: Orthopedic Surgery | Admitting: Orthopedic Surgery

## 2020-12-06 ENCOUNTER — Other Ambulatory Visit: Payer: Self-pay

## 2020-12-06 DIAGNOSIS — Z01812 Encounter for preprocedural laboratory examination: Secondary | ICD-10-CM | POA: Insufficient documentation

## 2020-12-06 NOTE — Patient Instructions (Signed)
Your procedure is scheduled on: Thursday 12/15/20.  Report to THE FIRST FLOOR REGISTRATION DESK IN THE MEDICAL MALL ON THE MORNING OF SURGERY FIRST, THEN YOU WILL CHECK IN AT THE SURGERY INFORMATION DESK LOCATED OUTSIDE THE SAME DAY SURGERY DEPARTMENT LOCATED ON 2ND FLOOR MEDICAL MALL ENTRANCE.  To find out your arrival time please call [phone] between 1PM - 3PM on Wednesday 12/14/20.   Remember: Instructions that are not followed completely may result in serious medical risk, up to and including death, or upon the discretion of your surgeon and anesthesiologist your surgery may need to be rescheduled.     __X__ 1. Do not eat food after midnight the night before your procedure.                 No gum chewing or hard candies. You may drink clear liquids up to 2 hours                 before you are scheduled to arrive for your surgery- DO NOT drink clear                 liquids within 2 hours of the start of your surgery.                 Clear Liquids include:  water, apple juice without pulp, clear carbohydrate                 drink such as Clearfast or Gatorade, Black Coffee or Tea (Do not add                 milk or creamer to coffee or tea).  ** Dr. Rudene Christians would like for you to finish the Clear Pre-Surgery Ensure 2 hours before your arrival time on the morning of surgery. **  __X__2.  On the morning of surgery brush your teeth with toothpaste and water, you may rinse your mouth with mouthwash if you wish.  Do not swallow any toothpaste or mouthwash.    __X__ 3.  No Alcohol for 24 hours before or after surgery.  __X__ 4.  Do Not Smoke or use e-cigarettes For 24 Hours Prior to Your Surgery.                 Do not use any chewable tobacco products for at least 6 hours prior to                 surgery.  __X__5.  Notify your doctor if there is any change in your medical condition      (cold, fever, infections).      Do NOT wear jewelry, make-up, hairpins, clips or nail polish. Do NOT  wear lotions, powders, or perfumes.  Do NOT shave 48 hours prior to surgery. Men may shave face and neck. Do NOT bring valuables to the hospital.     [facility] is not responsible for any belongings or valuables.   Contacts, dentures/partials or body piercings may not be worn into surgery. Bring a case for your contacts, glasses or hearing aids, a denture cup will be supplied. Leave your suitcase in the car. After surgery it may be brought to your room.   For patients admitted to the hospital, discharge time is determined by your treatment team.    Patients discharged the day of surgery will not be allowed to drive home.     __X__ Take these medicines the morning of surgery with A SIP OF WATER:  1. None    __X__ Use CHG Soap as directed.  __X__ Stop metformin/Janumet/Farxiga 2 days prior to surgery    __X__ Stop Blood Thinners Eliquis according to Dr. Redgie Grayer instruction.   __X__ Stop Anti-inflammatories 7 days before surgery such as Advil, Ibuprofen, Motrin, BC or Goodies Powder, Naprosyn, Naproxen, Aleve, Aspirin, Meloxicam. May take Tylenol if needed for pain or discomfort.   __X__Do not start taking any new herbal supplements or vitamins prior to your procedure.  __X__ Stop the following herbal supplements or vitamins:  vitamin C (ASCORBIC ACID)   Wear comfortable clothing (specific to your surgery type) to the hospital.  Plan for stool softeners for home use; pain medications have a tendency to cause constipation. You can also help prevent constipation by eating foods high in fiber such as fruits and vegetables and drinking plenty of fluids as your diet allows.  After surgery, you can prevent lung complications by doing breathing exercises.Take deep breaths and cough every 1-2 hours. Your doctor may order a device called an Incentive Spirometer to help you take deep breaths.  Please call the [place] Department at [phone] if you have any  questions about these instructions

## 2020-12-13 ENCOUNTER — Other Ambulatory Visit
Admission: RE | Admit: 2020-12-13 | Discharge: 2020-12-13 | Disposition: A | Discharge status: Home / Self Care | Payer: Medicare Other | Source: Ambulatory Visit | Attending: Orthopedic Surgery | Admitting: Orthopedic Surgery

## 2020-12-13 ENCOUNTER — Encounter: Payer: Self-pay | Admitting: Orthopedic Surgery

## 2020-12-13 ENCOUNTER — Other Ambulatory Visit: Payer: Self-pay

## 2020-12-13 DIAGNOSIS — Z01812 Encounter for preprocedural laboratory examination: Secondary | ICD-10-CM | POA: Insufficient documentation

## 2020-12-13 DIAGNOSIS — Z20822 Contact with and (suspected) exposure to covid-19: Secondary | ICD-10-CM | POA: Diagnosis not present

## 2020-12-13 LAB — CBC
HCT: 41.5 % (ref 39.0–52.0)
Hemoglobin: 14 g/dL (ref 13.0–17.0)
MCH: 32.1 pg (ref 26.0–34.0)
MCHC: 33.7 g/dL (ref 30.0–36.0)
MCV: 95.2 fL (ref 80.0–100.0)
Platelets: 262 10*3/uL (ref 150–400)
RBC: 4.36 MIL/uL (ref 4.22–5.81)
RDW: 13.4 % (ref 11.5–15.5)
WBC: 8.1 10*3/uL (ref 4.0–10.5)
nRBC: 0 % (ref 0.0–0.2)

## 2020-12-13 LAB — COMPREHENSIVE METABOLIC PANEL
ALT: 21 U/L (ref 0–44)
AST: 29 U/L (ref 15–41)
Albumin: 4.1 g/dL (ref 3.5–5.0)
Alkaline Phosphatase: 65 U/L (ref 38–126)
Anion gap: 6 (ref 5–15)
BUN: 20 mg/dL (ref 8–23)
CO2: 27 mmol/L (ref 22–32)
Calcium: 9 mg/dL (ref 8.9–10.3)
Chloride: 105 mmol/L (ref 98–111)
Creatinine, Ser: 0.94 mg/dL (ref 0.61–1.24)
GFR, Estimated: >60 mL/min (ref >60)
Glucose, Bld: 109 mg/dL — ABNORMAL HIGH (ref 70–99)
Potassium: 3.8 mmol/L (ref 3.5–5.1)
Sodium: 138 mmol/L (ref 135–145)
Total Bilirubin: 0.9 mg/dL (ref 0.3–1.2)
Total Protein: 7.3 g/dL (ref 6.5–8.1)

## 2020-12-13 LAB — SARS CORONAVIRUS 2 (TAT 6-24 HRS): SARS Coronavirus 2: NEGATIVE

## 2020-12-13 LAB — URINALYSIS, ROUTINE W REFLEX MICROSCOPIC
Bilirubin Urine: NEGATIVE
Glucose, UA: NEGATIVE mg/dL
Hgb urine dipstick: NEGATIVE
Ketones, ur: NEGATIVE mg/dL
Leukocytes,Ua: NEGATIVE
Nitrite: NEGATIVE
Protein, ur: NEGATIVE mg/dL
Specific Gravity, Urine: 1.017 (ref 1.005–1.030)
pH: 5 (ref 5.0–8.0)

## 2020-12-13 NOTE — Progress Notes (Signed)
Perioperative Services  Pre-Admission/Anesthesia Testing Clinical Review  Date: 12/13/20  Patient Demographics:  Name: Kerry Graham DOB:   01/21/1941 MRN:   [id number]  Planned Surgical Procedure(s):    Case: [id number] Date/Time: 12/15/20 1430   Procedure: Right knee arthroscopy, partial medial meniscectomy (Right Knee)   Anesthesia type: Choice   Pre-op diagnosis: Complex tear of medial meniscus of right knee as current injury, subsequent encounter S83.231D   Location: [facility] [facility]   Surgeons: Hessie Knows, MD    NOTE: Available PAT nursing documentation and vital signs have been reviewed. Clinical nursing staff has updated patient's PMH/PSHx, current medication list, and drug allergies/intolerances to ensure comprehensive history available to assist in medical decision making as it pertains to the aforementioned surgical procedure and anticipated anesthetic course.   Clinical Discussion:  Kerry Graham is a 80 y.o. male who is submitted for pre-surgical anesthesia review and clearance prior to him undergoing the above procedure. Patient has never been a smoker. Pertinent PMH includes: CAD (s/p CABG), atrial fibrillation, HTN, HLD.  Patient is followed by cardiology Sheppard Coil, MD). He was last seen in the cardiology clinic on 12/01/2020; notes reviewed.  At the time of this clinic visit, patient doing well overall from a cardiovascular perspective.  He denied any chest pain, shortness of breath, PND, orthopnea, peripheral edema, vertiginous symptoms, or presyncope/syncope.  Patient with a brief episode (< 1 minute) episode of recent tachypalpitations that resolved without treatment.  Patient with a cardiovascular history significant for CAD.  Patient underwent three-vessel CABG in 2001.  He also has a history of atrial fibrillation.  At the time of his clinic visit, patient noted to be doing well overall from a cardiovascular perspective. He denied  chest pain, shortness of breath, orthopnea, PND, palpitations, peripheral edema, vertiginous symptoms, and presyncope/syncope. CHA2DS2-VASc Score = 4. Patient chronically anticoagulated using apixaban; compliant with therapy with no evidence of GI bleeding.  Last TTE was performed in 05/2017 that revealed normal left ventricular systolic function with trivial valvular insufficiency; LVEF >55%.  DCCV procedure was performed in 07/2017 restoring NSR.  Rate/rhythm maintained with metoprolol and dofetilide. Patient underwent cardiac MRI in 11/2018 that revealed no evidence of significant stenosis.  Patient underwent cardiac ablation procedure on 12/15/2018 that restored NSR.  Patient had a recurrence of atrial arrhythmia in 06/2020 (atrial flutter) which was successfully treated with DCCV procedure on 07/06/2020.  Since cardioversion, patient has not had any recurrent prolonged episodes of atrial arrhythmias (see full interpretation of cardiovascular testing and interventions below). Dofetilide has been discontinued; rate/rhythm management with beta-blocker monotherapy.  Patient on GDMT for his HTN and HLD diagnoses.  Blood pressure well controlled at 110 and 58 on currently prescribed CCB, beta-blocker, and ACEi therapies.  Patient is on a statin for his HLD.  Functional capacity limited by orthopedic pain, however felt to be at at least 4 METS.  No changes were made to patient's medication regimen.  Patient to follow-up with cardiology in 1 year or sooner if needed.  Patient is scheduled to undergo an elective orthopedic procedure on 12/15/2020 with Dr. Hessie Knows.  Given patient's past medical history significant for cardiovascular disease and intervention, presurgical cardiac clearance was sought by the performing surgeon's office and PAT team.  Per cardiology, "patient is at a LOW risk for cardiovascular complications from the planned knee surgery.  Unless he develops new symptoms, no further testing is  required.  His cardiac meds, excluding apixaban, should be  continued in the perioperative period".  Again this patient is on daily anticoagulation therapy.  He has been instructed on recommendations from surgery and cardiology for holding his apixaban with plans to resume therapy as soon as postoperative bleeding risk deemed to be minimal by primary attending surgeon.  Patient denies previous perioperative complications with anesthesia in the past. In review of the available records, it is noted that patient underwent a general anesthetic course at [facility] (ASA [facility]) in 07/2020 without documented complications.   Vitals with BMI 12/06/2020 05/09/2020 11/18/2019  Height 5\' 8"  5\' 8"  -  Weight 172 lbs 171 lbs -  BMI 51.02 58.52 -  Systolic - 778 242  Diastolic - 70 52  Pulse - 68 -    Providers/Specialists:   NOTE: Primary physician provider listed below. Patient may have been seen by APP or partner within same practice.   PROVIDER ROLE / SPECIALTY LAST Fabio Bering, MD Orthopedics (Surgeon)  11/23/2020  Rusty Aus, MD Primary Care Provider  10/27/2020  Pleas Patricia, MD Cardiology  12/01/2020   Allergies:  Patient has no known allergies.  Current Home Medications:   No current facility-administered medications for this encounter.   Marland Kitchen amLODipine (NORVASC) 5 MG tablet  . atorvastatin (LIPITOR) 80 MG tablet  . cetirizine (ZYRTEC) 10 MG tablet  . ELIQUIS 5 MG TABS tablet  . ibuprofen (ADVIL) 200 MG tablet  . meclizine (ANTIVERT) 25 MG tablet  . metoprolol succinate (TOPROL-XL) 50 MG 24 hr tablet  . Multiple Vitamins-Minerals (MULTIVITAMIN WITH MINERALS) tablet  . ramipril (ALTACE) 10 MG capsule  . vitamin B-12 (CYANOCOBALAMIN) 1000 MCG tablet  . vitamin C (ASCORBIC ACID) 500 MG tablet  . Vitamin D3 (VITAMIN D) 25 MCG tablet  . diltiazem (CARDIZEM) 30 MG tablet   History:   Past Medical History:  Diagnosis Date  . Atrial fibrillation ([place])   . Chronic  anticoagulation   . Coronary artery disease   . Hx of CABG 2001  . Hyperlipidemia   . Hypertension   . Vitamin B 12 deficiency    Past Surgical History:  Procedure Laterality Date  . ATRIAL FIBRILLATION ABLATION    . CARDIAC CATHETERIZATION    . CARDIAC SURGERY    . COLONOSCOPY WITH PROPOFOL N/A 11/18/2019   Procedure: COLONOSCOPY WITH PROPOFOL;  Surgeon: Toledo, Benay Pike, MD;  Location: [facility] END[facility];  Service: Gastroenterology;  Laterality: N/A;  PATIENT ON ELIQUIS  . CORONARY ARTERY BYPASS GRAFT    . HERNIA REPAIR     No family history on file. Social History   Tobacco Use  . Smoking status: Never Smoker  . Smokeless tobacco: Never Used  Vaping Use  . Vaping Use: Never used  Substance Use Topics  . Alcohol use: No  . Drug use: No    Pertinent Clinical Results:  LABS: Labs reviewed: Acceptable for surgery.  Hospital Outpatient Visit on 12/13/2020  Component Date Value Ref Range Status  . WBC 12/13/2020 8.1  4.0 - 10.5 K/uL Final  . RBC 12/13/2020 4.36  4.22 - 5.81 MIL/uL Final  . Hemoglobin 12/13/2020 14.0  13.0 - 17.0 g/dL Final  . HCT 12/13/2020 41.5  39.0 - 52.0 % Final  . MCV 12/13/2020 95.2  80.0 - 100.0 fL Final  . MCH 12/13/2020 32.1  26.0 - 34.0 pg Final  . MCHC 12/13/2020 33.7  30.0 - 36.0 g/dL Final  . RDW 12/13/2020 13.4  11.5 - 15.5 % Final  . Platelets 12/13/2020 262  150 - 400 K/uL Final  . nRBC 12/13/2020 0.0  0.0 - 0.2 % Final   Performed at [facility], [address] [place], [place] [address]  . Color, Urine 12/13/2020 YELLOW* YELLOW Final  . APPearance 12/13/2020 HAZY* CLEAR Final  . Specific Gravity, Urine 12/13/2020 1.017  1.005 - 1.030 Final  . pH 12/13/2020 5.0  5.0 - 8.0 Final  . Glucose, UA 12/13/2020 NEGATIVE  NEGATIVE mg/dL Final  . Hgb urine dipstick 12/13/2020 NEGATIVE  NEGATIVE Final  . Bilirubin Urine 12/13/2020 NEGATIVE  NEGATIVE Final  . Ketones, ur 12/13/2020 NEGATIVE  NEGATIVE mg/dL Final  . Protein, ur  12/13/2020 NEGATIVE  NEGATIVE mg/dL Final  . Nitrite 12/13/2020 NEGATIVE  NEGATIVE Final  . Chalmers Guest 12/13/2020 NEGATIVE  NEGATIVE Final   Performed at [facility], [address], [place], [place] [address]  . Sodium 12/13/2020 138  135 - 145 mmol/L Final  . Potassium 12/13/2020 3.8  3.5 - 5.1 mmol/L Final  . Chloride 12/13/2020 105  98 - 111 mmol/L Final  . CO2 12/13/2020 27  22 - 32 mmol/L Final  . Glucose, Bld 12/13/2020 109* 70 - 99 mg/dL Final   Glucose reference range applies only to samples taken after fasting for at least 8 hours.  . BUN 12/13/2020 20  8 - 23 mg/dL Final  . Creatinine, Ser 12/13/2020 0.94  0.61 - 1.24 mg/dL Final  . Calcium 12/13/2020 9.0  8.9 - 10.3 mg/dL Final  . Total Protein 12/13/2020 7.3  6.5 - 8.1 g/dL Final  . Albumin 12/13/2020 4.1  3.5 - 5.0 g/dL Final  . AST 12/13/2020 29  15 - 41 U/L Final  . ALT 12/13/2020 21  0 - 44 U/L Final  . Alkaline Phosphatase 12/13/2020 65  38 - 126 U/L Final  . Total Bilirubin 12/13/2020 0.9  0.3 - 1.2 mg/dL Final  . GFR, Estimated 12/13/2020 >60  >60 mL/min Final   Comment: (NOTE) Calculated using the CKD-EPI Creatinine Equation (2021)   . Anion gap 12/13/2020 6  5 - 15 Final   Performed at [facility], [address] [place], [place] [address]    ECG: Date: 10/06/2020 Rate: 73 bpm Rhythm: normal sinus Intervals: PR 144 ms. QRS 88 ms. QTc 412 ms. ST segment and T wave changes: Nonspecific inferolateral T wave abnormality Comparison: Similar to previous tracing obtained on 07/06/2020 NOTE: Tracing obtained at [facility]; unable for review. Above based on cardiologist's interpretation.   IMAGING / PROCEDURES: CARDIAC MRA performed on 11/28/2018 1. There are four pulmonary veins entering the left atrium (two on the right and two on theleft).  2. All veins are patent without significant stenosis proximally.  3. The bi-orthogonal luminal dimensions are listed below:   RUPV: 2.0 x 2.0 cm    RLPV: 1.7 x 1.2 cm   LUPV: 1.7 x 1.0 cm   LLPV: 1.7 x 1.0 cm  4. Evaluation of the aorta is limited by susceptibility artifact from surgical hardware. The visualized thoracic aorta is normal in diameter without dissection.  5. The main pulmonary artery is normal in size  6. Systemic venous connections are normal.  7. The esophagus abuts the posterior wall of the left atrium, and is in close proximity to the ostium of the right inferior pulmonary vein.  8. Limited view of the abdomen demonstrates a 1.6 cm T2 hyperintense left liver lesion, not characterizable  by this cardiovascular MRI, but possibly a cyst. 9. There is also a tiny 26mm  lesion on the left kidney, not characterizable by this examination.    CARDIAC MRI performed on 11/28/2018 1. The left ventricle is normal in cavity size and with mild basal sigmoid septal hypertrophy.  2. Global systolic function is normal with visual LV ejection fraction of 55%.  3. There is mild hypokinesis of the basal inferior wall.  4. The right ventricle is normal in cavity size, wall thickness, and systolic function.  5. Both atria are mildly enlarged in size.  6. The aortic valve is trileaflet in morphology. There is no significant aortic valve stenosis or regurgitation. There is mild tricuspid regurgitation. There is mild thickening of the mitral valve leaflets.  7. Delayed enhancement imaging is abnormal. 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 There is mild nonspecific mid-myocardial scarring in the basal interventricular septum.   ECHOCARDIOGRAM performed on 05/16/2017 1. Normal left ventricular function; LVEF >55% 2. Normal right ventricular systolic function 3. Trivial mitral, pulmonic, and tricuspid regurgitation; no aortic regurgitation 4. No evidence of valvular stenosis 5. Left ventricle views are limited but function appears grossly normal 6. Right ventricle not  well seen but appears grossly normal 7. Poor sound transmission  CORONARY ARTERY BYPASS GRAFTING SURGERY performed on 01/22/2000 1. R PDA - saphenous vein 2. OM 2 - saphenous vein 3. Ram Int - left radial artery 4. LAD - LIMA  Impression and Plan:  Kerry Graham has been referred for pre-anesthesia review and clearance prior to him undergoing the planned anesthetic and procedural courses. Available labs, pertinent testing, and imaging results were personally reviewed by me. This patient has been appropriately cleared by cardiology with an overall LOW risk of significant perioperative cardiovascular complications.  Based on clinical review performed today (12/13/20), barring any significant acute changes in the patient's overall condition, it is anticipated that he will be able to proceed with the planned surgical intervention. Any acute changes in clinical condition may necessitate his procedure being postponed and/or cancelled. Pre-surgical instructions were reviewed with the patient during his PAT appointment and questions were fielded by PAT clinical staff.  Honor Loh, MSN, APRN, FNP-C, CEN [facility]  Peri-operative Services Nurse Practitioner Phone: [phone] 12/13/20 3:09 PM  NOTE: This note has been prepared using Dragon dictation software. Despite my best ability to proofread, there is always the potential that unintentional transcriptional errors may still occur from this process.

## 2020-12-14 MED ORDER — FAMOTIDINE 20 MG PO TABS: 20.0000 mg | ORAL_TABLET | Freq: Once | ORAL | Status: AC

## 2020-12-14 MED ORDER — LACTATED RINGERS IV SOLN: INTRAVENOUS | Status: DC

## 2020-12-14 MED ORDER — CHLORHEXIDINE GLUCONATE 0.12 % MT SOLN: 15.0000 mL | Freq: Once | OROMUCOSAL | Status: AC

## 2020-12-14 MED ORDER — ORAL CARE MOUTH RINSE: 15.0000 mL | Freq: Once | OROMUCOSAL | Status: AC

## 2020-12-15 ENCOUNTER — Ambulatory Visit: Payer: Medicare Other | Admitting: Urgent Care

## 2020-12-15 ENCOUNTER — Ambulatory Visit
Admission: RE | Admit: 2020-12-15 | Discharge: 2020-12-15 | Disposition: A | Discharge status: Home / Self Care | Payer: Medicare Other | Attending: Orthopedic Surgery | Admitting: Orthopedic Surgery

## 2020-12-15 ENCOUNTER — Encounter
Admission: RE | Disposition: A | Discharge status: Home / Self Care | Payer: Self-pay | Source: Home / Self Care | Attending: Orthopedic Surgery

## 2020-12-15 ENCOUNTER — Encounter: Payer: Self-pay | Admitting: Orthopedic Surgery

## 2020-12-15 ENCOUNTER — Other Ambulatory Visit: Payer: Self-pay

## 2020-12-15 DIAGNOSIS — I4891 Unspecified atrial fibrillation: Secondary | ICD-10-CM | POA: Diagnosis not present

## 2020-12-15 DIAGNOSIS — I10 Essential (primary) hypertension: Secondary | ICD-10-CM | POA: Insufficient documentation

## 2020-12-15 DIAGNOSIS — I2510 Atherosclerotic heart disease of native coronary artery without angina pectoris: Secondary | ICD-10-CM | POA: Insufficient documentation

## 2020-12-15 DIAGNOSIS — E785 Hyperlipidemia, unspecified: Secondary | ICD-10-CM | POA: Diagnosis not present

## 2020-12-15 DIAGNOSIS — M93861 Other specified osteochondropathies, right lower leg: Secondary | ICD-10-CM | POA: Insufficient documentation

## 2020-12-15 DIAGNOSIS — Z79899 Other long term (current) drug therapy: Secondary | ICD-10-CM | POA: Insufficient documentation

## 2020-12-15 DIAGNOSIS — S83231A Complex tear of medial meniscus, current injury, right knee, initial encounter: Secondary | ICD-10-CM | POA: Diagnosis not present

## 2020-12-15 DIAGNOSIS — X58XXXA Exposure to other specified factors, initial encounter: Secondary | ICD-10-CM | POA: Insufficient documentation

## 2020-12-15 DIAGNOSIS — Z7901 Long term (current) use of anticoagulants: Secondary | ICD-10-CM | POA: Diagnosis not present

## 2020-12-15 DIAGNOSIS — Z951 Presence of aortocoronary bypass graft: Secondary | ICD-10-CM | POA: Insufficient documentation

## 2020-12-15 HISTORY — PX: KNEE ARTHROSCOPY WITH MEDIAL MENISECTOMY: SHX5651

## 2020-12-15 HISTORY — DX: Long term (current) use of anticoagulants: Z79.01

## 2020-12-15 SURGERY — ARTHROSCOPY, KNEE, WITH MEDIAL MENISCECTOMY
Anesthesia: General | Site: Knee | Laterality: Right

## 2020-12-15 MED ORDER — ACETAMINOPHEN 10 MG/ML IV SOLN
INTRAVENOUS | Status: AC
  Filled 2020-12-15: qty 100

## 2020-12-15 MED ORDER — METOCLOPRAMIDE HCL 10 MG PO TABS: 5.0000 mg | ORAL_TABLET | Freq: Three times a day (TID) | ORAL | Status: DC | PRN

## 2020-12-15 MED ORDER — METOCLOPRAMIDE HCL 5 MG/ML IJ SOLN: 5.0000 mg | Freq: Three times a day (TID) | INTRAMUSCULAR | Status: DC | PRN

## 2020-12-15 MED ORDER — FENTANYL CITRATE (PF) 100 MCG/2ML IJ SOLN
INTRAMUSCULAR | Status: AC
  Filled 2020-12-15: qty 2

## 2020-12-15 MED ORDER — FENTANYL CITRATE (PF) 100 MCG/2ML IJ SOLN
INTRAMUSCULAR | Status: AC
  Administered 2020-12-15: 25 ug via INTRAVENOUS
  Filled 2020-12-15: qty 2

## 2020-12-15 MED ORDER — FENTANYL CITRATE (PF) 100 MCG/2ML IJ SOLN
INTRAMUSCULAR | Status: DC | PRN
  Administered 2020-12-15 (×3): 25 ug via INTRAVENOUS

## 2020-12-15 MED ORDER — GLYCOPYRROLATE 0.2 MG/ML IJ SOLN
INTRAMUSCULAR | Status: DC | PRN
  Administered 2020-12-15: .2 mg via INTRAVENOUS

## 2020-12-15 MED ORDER — BUPIVACAINE-EPINEPHRINE (PF) 0.5% -1:200000 IJ SOLN
INTRAMUSCULAR | Status: AC
  Filled 2020-12-15: qty 30

## 2020-12-15 MED ORDER — OXYCODONE HCL 5 MG PO TABS
ORAL_TABLET | ORAL | Status: AC
  Filled 2020-12-15: qty 1

## 2020-12-15 MED ORDER — FAMOTIDINE 20 MG PO TABS
ORAL_TABLET | ORAL | Status: AC
  Administered 2020-12-15: 20 mg via ORAL
  Filled 2020-12-15: qty 1

## 2020-12-15 MED ORDER — BUPIVACAINE-EPINEPHRINE (PF) 0.5% -1:200000 IJ SOLN
INTRAMUSCULAR | Status: DC | PRN
  Administered 2020-12-15: 30 mL

## 2020-12-15 MED ORDER — ONDANSETRON HCL 4 MG PO TABS: 4.0000 mg | ORAL_TABLET | Freq: Four times a day (QID) | ORAL | Status: DC | PRN

## 2020-12-15 MED ORDER — ONDANSETRON HCL 4 MG/2ML IJ SOLN: 4.0000 mg | Freq: Four times a day (QID) | INTRAMUSCULAR | Status: DC | PRN

## 2020-12-15 MED ORDER — EPHEDRINE 5 MG/ML INJ
INTRAVENOUS | Status: AC
  Filled 2020-12-15: qty 10

## 2020-12-15 MED ORDER — ONDANSETRON HCL 4 MG/2ML IJ SOLN: 4.0000 mg | Freq: Once | INTRAMUSCULAR | Status: DC | PRN

## 2020-12-15 MED ORDER — DEXMEDETOMIDINE (PRECEDEX) IN NS 20 MCG/5ML (4 MCG/ML) IV SYRINGE
PREFILLED_SYRINGE | INTRAVENOUS | Status: DC | PRN
  Administered 2020-12-15 (×2): 8 ug via INTRAVENOUS

## 2020-12-15 MED ORDER — FENTANYL CITRATE (PF) 100 MCG/2ML IJ SOLN
25.0000 ug | INTRAMUSCULAR | Status: DC | PRN
Start: 2020-12-15 — End: 2020-12-15
  Administered 2020-12-15: 25 ug via INTRAVENOUS

## 2020-12-15 MED ORDER — ACETAMINOPHEN 10 MG/ML IV SOLN
INTRAVENOUS | Status: DC | PRN
  Administered 2020-12-15: 1000 mg via INTRAVENOUS

## 2020-12-15 MED ORDER — SODIUM CHLORIDE 0.9 % IV SOLN: INTRAVENOUS | Status: DC

## 2020-12-15 MED ORDER — DEXAMETHASONE SODIUM PHOSPHATE 10 MG/ML IJ SOLN
INTRAMUSCULAR | Status: DC | PRN
  Administered 2020-12-15: 10 mg via INTRAVENOUS

## 2020-12-15 MED ORDER — MIDAZOLAM HCL 2 MG/2ML IJ SOLN
INTRAMUSCULAR | Status: AC
  Filled 2020-12-15: qty 2

## 2020-12-15 MED ORDER — LIDOCAINE HCL (CARDIAC) PF 100 MG/5ML IV SOSY
PREFILLED_SYRINGE | INTRAVENOUS | Status: DC | PRN
  Administered 2020-12-15: 100 mg via INTRAVENOUS

## 2020-12-15 MED ORDER — OXYCODONE HCL 5 MG PO TABS: 5.0000 mg | ORAL_TABLET | Freq: Three times a day (TID) | ORAL | 0 refills | Status: AC | PRN

## 2020-12-15 MED ORDER — ONDANSETRON HCL 4 MG/2ML IJ SOLN
INTRAMUSCULAR | Status: DC | PRN
  Administered 2020-12-15: 4 mg via INTRAVENOUS

## 2020-12-15 MED ORDER — PROPOFOL 10 MG/ML IV BOLUS
INTRAVENOUS | Status: DC | PRN
  Administered 2020-12-15: 200 mg via INTRAVENOUS

## 2020-12-15 MED ORDER — CHLORHEXIDINE GLUCONATE 0.12 % MT SOLN
OROMUCOSAL | Status: AC
  Administered 2020-12-15: 15 mL via OROMUCOSAL
  Filled 2020-12-15: qty 15

## 2020-12-15 MED ORDER — PHENYLEPHRINE HCL (PRESSORS) 10 MG/ML IV SOLN
INTRAVENOUS | Status: DC | PRN
  Administered 2020-12-15: 100 ug via INTRAVENOUS

## 2020-12-15 MED ORDER — OXYCODONE HCL 5 MG PO TABS
5.0000 mg | ORAL_TABLET | Freq: Three times a day (TID) | ORAL | Status: DC | PRN
  Administered 2020-12-15: 5 mg via ORAL

## 2020-12-15 MED ORDER — EPHEDRINE SULFATE 50 MG/ML IJ SOLN
INTRAMUSCULAR | Status: DC | PRN
  Administered 2020-12-15: 5 mg via INTRAVENOUS

## 2020-12-15 SURGICAL SUPPLY — 31 items
APL PRP STRL LF DISP 70% ISPRP (MISCELLANEOUS) ×1
BLADE INCISOR PLUS 4.5 (BLADE) IMPLANT
BNDG ELASTIC 4X5.8 VLCR STR LF (GAUZE/BANDAGES/DRESSINGS) IMPLANT
CHLORAPREP W/TINT 26 (MISCELLANEOUS) ×2 IMPLANT
COVER WAND RF STERILE (DRAPES) ×2 IMPLANT
CUFF TOURN SGL QUICK 24 (TOURNIQUET CUFF) ×2
CUFF TOURN SGL QUICK 30 (TOURNIQUET CUFF)
CUFF TRNQT CYL 24X4X16.5-23 (TOURNIQUET CUFF) IMPLANT
CUFF TRNQT CYL 30X4X21-28X (TOURNIQUET CUFF) IMPLANT
DRAPE C-ARMOR (DRAPES) IMPLANT
GAUZE SPONGE 4X4 12PLY STRL (GAUZE/BANDAGES/DRESSINGS) ×2 IMPLANT
GLOVE SURG SYN 9.0  PF PI (GLOVE) ×1
GLOVE SURG SYN 9.0 PF PI (GLOVE) ×1 IMPLANT
GOWN SRG 2XL LVL 4 RGLN SLV (GOWNS) ×1 IMPLANT
GOWN STRL NON-REIN 2XL LVL4 (GOWNS) ×2
GOWN STRL REUS W/ TWL LRG LVL3 (GOWN DISPOSABLE) ×2 IMPLANT
GOWN STRL REUS W/TWL LRG LVL3 (GOWN DISPOSABLE) ×4
IV LACTATED RINGER IRRG 3000ML (IV SOLUTION) ×4
IV LR IRRIG 3000ML ARTHROMATIC (IV SOLUTION) ×2 IMPLANT
KIT TURNOVER KIT A (KITS) ×2 IMPLANT
MANIFOLD NEPTUNE II (INSTRUMENTS) ×3 IMPLANT
NEEDLE HYPO 22GX1.5 SAFETY (NEEDLE) ×2 IMPLANT
PACK ARTHROSCOPY KNEE (MISCELLANEOUS) ×2 IMPLANT
SCALPEL PROTECTED #11 DISP (BLADE) ×2 IMPLANT
SET TUBE SUCT SHAVER OUTFL 24K (TUBING) ×2 IMPLANT
SET TUBE TIP INTRA-ARTICULAR (MISCELLANEOUS) ×2 IMPLANT
SUT ETHILON 4-0 (SUTURE) ×2
SUT ETHILON 4-0 FS2 18XMFL BLK (SUTURE) ×1
SUTURE ETHLN 4-0 FS2 18XMF BLK (SUTURE) ×1 IMPLANT
TUBING ARTHRO INFLOW-ONLY STRL (TUBING) ×2 IMPLANT
WAND COBLATION FLOW 50 (SURGICAL WAND) ×2 IMPLANT

## 2020-12-15 NOTE — Anesthesia Preprocedure Evaluation (Signed)
Anesthesia Evaluation  Patient identified by MRN, date of birth, ID band Patient awake    Reviewed: Allergy & Precautions, H&P , NPO status , Patient's Chart, lab work & pertinent test results, reviewed documented beta blocker date and time   Airway Mallampati: III  TM Distance: <3 FB Neck ROM: full    Dental  (+) Teeth Intact, Poor Dentition   Pulmonary neg pulmonary ROS,    Pulmonary exam normal        Cardiovascular Exercise Tolerance: Poor hypertension, On Medications + CAD and + CABG  Atrial Fibrillation  Rate:Normal     Neuro/Psych negative neurological ROS  negative psych ROS   GI/Hepatic negative GI ROS, Neg liver ROS,   Endo/Other  negative endocrine ROS  Renal/GU negative Renal ROS  negative genitourinary   Musculoskeletal   Abdominal   Peds  Hematology negative hematology ROS (+)   Anesthesia Other Findings   Reproductive/Obstetrics negative OB ROS                             Anesthesia Physical Anesthesia Plan  ASA: III  Anesthesia Plan: General LMA   Post-op Pain Management:    Induction:   PONV Risk Score and Plan: 3  Airway Management Planned:   Additional Equipment:   Intra-op Plan:   Post-operative Plan:   Informed Consent: I have reviewed the patients History and Physical, chart, labs and discussed the procedure including the risks, benefits and alternatives for the proposed anesthesia with the patient or authorized representative who has indicated his/her understanding and acceptance.       Plan Discussed with: CRNA  Anesthesia Plan Comments:         Anesthesia Quick Evaluation

## 2020-12-15 NOTE — Transfer of Care (Signed)
Immediate Anesthesia Transfer of Care Note  Patient: Kerry Graham  Procedure(s) Performed: Right knee arthroscopy, partial medial meniscectomy (Right Knee)  Patient Location: PACU  Anesthesia Type:General  Level of Consciousness: awake, drowsy and patient cooperative  Airway & Oxygen Therapy: Patient Spontanous Breathing and Patient connected to face mask oxygen  Post-op Assessment: Report given to RN and Post -op Vital signs reviewed and stable  Post vital signs: Reviewed and stable  Last Vitals:  Vitals Value Taken Time  BP 111/68 12/15/20 1600  Temp 36.7 C 12/15/20 1555  Pulse 67 12/15/20 1607  Resp 16 12/15/20 1607  SpO2 100 % 12/15/20 1607  Vitals shown include unvalidated device data.  Last Pain:  Vitals:   12/15/20 1555  TempSrc:   PainSc: 0-No pain         Complications: No complications documented.

## 2020-12-15 NOTE — H&P (Signed)
Chief Complaint  Patient presents with  . Follow-up  Discuss Rt knee osteochaondral injury    History of the Present Illness: Kerry Graham is a 80 y.o. male here today.   The patient presents for follow-up evaluation of right knee pain. He had an MRI on 10/12/2020, that showed an osteochondral defect, medial meniscus tear, cartilage defect of the patella, subchondral bone plate in the central weight-bearing aspect of the medial femoral condyle was about a 1.5 x 2 cm area that was flattened that had not fallen out. I saw him after the MRI, and he was having less symptoms, and we were holding off on surgery to see how he was doing. He comes back today for reevaluation.  The patient states he has days of feeling good, and some days he does not walk very well. He states today he is walking pretty good. He states when he feels bad, he starts having pain and feels like his right knee gives way, and he does not feel he has a normal step. The patient states he has right knee weakness. He states today he has a little bit of pain, but not much. He states he goes walking in the mall, and some days he can walk well, and other days he can only make 1 lap around, and he is in pain. He states the most laps he has ever done was 5, and it did not seem to bother him that day, but the next day he was sore. He states he had a good day yesterday. The patient does not recall any swelling. He locates his pain along the joint line. He states he has radiating pain down the right leg after walking for over an hour.  He notes he occasionally [place] in the [place], but not far.   I have reviewed past medical, surgical, social and family history, and allergies as documented in the EMR.  Past Medical History: Past Medical History:  Diagnosis Date  . B12 deficiency 10/03/2016  285, 1/18  . Coronary artery disease  . Hyperlipidemia  . Hypertension   Past Surgical History: Past Surgical History:  Procedure Laterality  Date  . COLONOSCOPY 04/14/2009, 08/10/2004, 06/04/1997, 11/18/1992, 10/23/1991  Dr. Kennis Carina - PHPolyps, rpt 5 yrs per RTE  . COLONOSCOPY 11/18/2019  Tubular adenoma of the colon/Repeat 68yrs/TKT  . CORONARY ARTERY BYPASS GRAFT  x4 [facility] 2001  . HERNIA REPAIR  x2, before year 2000   Past Family History: Family History  Problem Relation Age of Onset  . Lung cancer Mother  . Myocardial Infarction (Heart attack) Father  . Anesthesia problems Neg Hx   Medications: Current Outpatient Medications Ordered in Epic  Medication Sig Dispense Refill  . amLODIPine (NORVASC) 5 MG tablet Take 1 tablet (5 mg total) by mouth once daily 90 tablet 3  . apixaban (ELIQUIS) 5 mg tablet Take 1 tablet (5 mg total) by mouth every 12 (twelve) hours 180 tablet 3  . atorvastatin (LIPITOR) 80 MG tablet Take 1 tablet (80 mg total) by mouth nightly 90 tablet 3  . cetirizine (ZYRTEC) 10 MG tablet Take 10 mg by mouth every evening  . clobetasoL (TEMOVATE) 0.05 % ointment  . cyanocobalamin (VITAMIN B12) 1000 MCG tablet Take 1,000 mcg by mouth every evening  . meclizine (ANTIVERT) 25 mg tablet Take 25 mg by mouth 3 (three) times daily as needed for Dizziness.  . metoprolol succinate (TOPROL-XL) 50 MG XL tablet Take 1 tablet (50 mg total) by mouth once daily 90  tablet 3  . ramipriL (ALTACE) 10 MG capsule Take 1 capsule (10 mg total) by mouth every evening 90 capsule 3   No current Epic-ordered facility-administered medications on file.   Allergies: No Known Allergies   Body mass index is 26.48 kg/m.  Review of Systems: A comprehensive 14 point ROS was performed, reviewed, and the pertinent orthopaedic findings are documented in the HPI.  Vitals:  11/23/20 0924  BP: 114/70    General Physical Examination:   General/Constitutional: No apparent distress: well-nourished and well developed. Eyes: Pupils equal, round with synchronous movement. Lungs: Clear to auscultation HEENT: Normal Vascular: No edema,  swelling or tenderness, except as noted in detailed exam. Cardiac: Heart rate and rhythm is regular. Integumentary: No impressive skin lesions present, except as noted in detailed exam. Neuro/Psych: Normal mood and affect, oriented to person, place and time.  Musculoskeletal Examination:  On exam, tenderness to the right knee along the joint line.  Radiographs:  No new imaging studies were obtained today.  Assessment: ICD-10-CM  1. Complex tear of medial meniscus of right knee as current injury, subsequent encounter S83.231D  2. 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  We discussed the patient's prior MRI findings. I recommend right knee arthroscopy. I explained the surgery and postoperative course in detail. The patient would like to proceed with surgery. We also discussed that a knee sleeve may be beneficial.  We will schedule the patient for right knee arthroscopy in the near future.  Surgical Risks:  The nature of the condition and the proposed procedure has been reviewed in detail with the patient. Surgical versus non-surgical options and prognosis for recovery have been reviewed and the inherent risks and benefits of each have been discussed including the risks of infection, bleeding, injury to nerves/blood vessels/tendons, incomplete relief of symptoms, persisting pain and/or stiffness, loss of function, complex regional pain syndrome, failure of the procedure, as appropriate.  Teeth: normal  Attestation: I, Dawn Royse, am documenting for [facility], MD utilizing Nuance DAX.     Electronically signed by Lauris Poag, MD at 11/23/2020 6:45 PM EST   Reviewed  H+P. No changes noted.

## 2020-12-15 NOTE — Discharge Instructions (Addendum)
Take it easy through the weekend Weightbearing as tolerated on right leg. Aspirin 325 mg daily until walking normally. Pain medicine as directed. Call office if you are having problems Keep dressing clean and d Knee Arthroscopy, Care After This sheet gives you information about how to care for yourself after your procedure. Your health care provider may also give you more specific instructions. If you have problems or questions, contact your health care provider. What can I expect after the procedure? After the procedure, it is common to have:  Soreness.  Swelling.  Pain.  A small amount of fluid from the incisions. Follow these instructions at home: Medicines  Take over-the-counter and prescription medicines only as told by your health care provider.  Ask your health care provider if the medicine prescribed to you: ? Requires you to avoid driving or using machinery. ? Can cause constipation. You may need to take these actions to prevent or treat constipation:  Drink enough fluid to keep your urine pale yellow.  Take over-the-counter or prescription medicines.  Eat foods that are high in fiber, such as beans, whole grains, and fresh fruits and vegetables.  Limit foods that are high in fat and processed sugars, such as fried or sweet foods. If you have a brace or immobilizer:  Wear it as told by your health care provider. Remove it only as told by your health care provider.  Loosen it if your toes tingle, become numb, or turn cold and blue.  Keep it clean and dry. Bathing  Do not take baths, swim, or use a hot tub until your health care provider approves. Ask your health care provider if you may take showers.  Keep your bandage (dressing) dry until your health care provider says that it can be removed.  If the brace or immobilizer is not waterproof: ? Do not let it get wet. ? Cover it with a watertight covering when you take a bath or shower. Incision care  Follow  instructions from your health care provider about how to take care of your incisions. Make sure you: ? Wash your hands with soap and water for at least 20 seconds before and after you change your dressing. If soap and water are not available, use hand sanitizer. ? Change your dressing as told by your health care provider. ? Leave stitches (sutures) or adhesive strips in place. These skin closures may need to stay in place for 2 weeks or longer. If adhesive strip edges start to loosen and curl up, you may trim the loose edges. Do not remove adhesive strips completely unless your health care provider tells you to do that.  Check your incision areas every day for signs of infection. Check for: ? Redness. ? More swelling or pain. ? Blood or more fluid. ? Warmth. ? Pus or a bad smell.   Managing pain, stiffness, and swelling  If directed, put ice on the injured area. To do this: ? If you have a removable brace or immobilizer, remove it as told by your health care provider. ? Put ice in a plastic bag or use the icing device (cold therapy unit) that you were given. Follow instructions about how to use the icing device. ? Place a towel between your skin and the bag or between your skin and the icing device. ? Leave the ice on for 20 minutes, 2-3 times a day. ? Remove the ice if your skin turns bright red. This is very important. If you cannot feel pain,  heat, or cold, you have a greater risk of damage to the area.  Move your toes often to reduce stiffness and swelling.  Raise (elevate) the injured area above the level of your heart while you are sitting or lying down.   Activity  Do not use your knee to support your body weight until your health care provider says that you can. Follow weight-bearing restrictions as told. Use crutches or other devices to help you move around (assistive devices) as told by your health care provider.  Ask your health care provider what activities are safe for you  during recovery, and what activities you need to avoid.  If physical therapy was prescribed, do exercises as told by your health care provider. Doing exercises may help improve knee movement, range of motion, and flexibility.  Do not lift anything that is heavier than 10 lb (4.5 kg), or the limit that you are told, until your health care provider says that it is safe. General instructions  Do not drive until your health care provider approves. You may be able to drive after 1-3 weeks.  Do not use any products that contain nicotine or tobacco, such as cigarettes, e-cigarettes, and chewing tobacco. These can delay incision or bone healing after surgery. If you need help quitting, ask your health care provider.  Wear compression stockings as told by your health care provider. These stockings help to prevent blood clots and reduce swelling in your legs.  Keep all follow-up visits. This is important. Contact a health care provider if:  You have any of these signs of infection: ? Redness or more pain around an incision. ? Blood or more fluid coming from an incision. ? Warmth coming from an incision. ? Pus or a bad smell coming from an incision. ? More swelling in your knee. ? A fever or chills.  You have severe knee pain, and medicine does not help.  An incision opens up. Get help right away if:  You have trouble breathing or shortness of breath.  You have chest pain.  You develop pain or swelling in your lower leg or at the back of your knee.  You have numbness or tingling in your lower leg or your foot.  You notice that your foot or toes look darker than normal or are cooler than normal. These symptoms may represent a serious problem that is an emergency. Do not wait to see if the symptoms will go away. Get medical help right away. Call your local emergency services (911 in the [place] Do not drive yourself to the hospital. Summary  To help relieve pain and swelling, put ice on  the injured area for 20 minutes, 2-3 times a day.  Raise (elevate) the injured area above the level of your heart while you are sitting or lying down.  If physical therapy was prescribed, do exercises as told by your health care provider. Exercises may help improve range of motion. This information is not intended to replace advice given to you by your health care provider. Make sure you discuss any questions you have with your health care provider. Document Revised: 01/18/2020 Document Reviewed: 01/18/2020 Elsevier Patient Education  [email or web address].     AMBULATORY SURGERY  DISCHARGE INSTRUCTIONS   1) The drugs that you were given will stay in your system until tomorrow so for the next 24 hours you should not:  A) Drive an automobile B) Make any legal decisions C) Drink any alcoholic beverage   2)  You may resume regular meals tomorrow.  Today it is better to start with liquids and gradually work up to solid foods.  You may eat anything you prefer, but it is better to start with liquids, then soup and crackers, and gradually work up to solid foods.   3) Please notify your doctor immediately if you have any unusual bleeding, trouble breathing, redness and pain at the surgery site, drainage, fever, or pain not relieved by medication.    4) Additional Instructions:        Please contact your physician with any problems or Same Day Surgery at [phone], Monday through Friday 6 am to 4 pm, or [place] [facility] number at [phone].

## 2020-12-15 NOTE — Anesthesia Procedure Notes (Signed)
Procedure Name: LMA Insertion Performed by: Kelton Pillar, CRNA Pre-anesthesia Checklist: Patient identified, Emergency Drugs available, Suction available and Patient being monitored Patient Re-evaluated:Patient Re-evaluated prior to induction Oxygen Delivery Method: Circle system utilized Preoxygenation: Pre-oxygenation with 100% oxygen Induction Type: IV induction Ventilation: Mask ventilation without difficulty LMA: LMA inserted LMA Size: 4.0 Placement Confirmation: positive ETCO2,  CO2 detector and breath sounds checked- equal and bilateral Tube secured with: Tape Dental Injury: Teeth and Oropharynx as per pre-operative assessment

## 2020-12-15 NOTE — Op Note (Signed)
12/15/2020  3:42 PM  PATIENT:  Kerry Graham  80 y.o. male  PRE-OPERATIVE DIAGNOSIS:  Complex tear of medial meniscus of right knee as current injury, subsequent encounter S83.231D  POST-OPERATIVE DIAGNOSIS:  Complex tear of medial meniscus of right knee as current injury, subsequent encounter S83.231D  PROCEDURE:  Procedure(s): Right knee arthroscopy, partial medial meniscectomy (Right)  SURGEON: Laurene Footman, MD  ASSISTANTS: None  ANESTHESIA:   general  EBL:  No intake/output data recorded.  BLOOD ADMINISTERED:none  DRAINS: none   LOCAL MEDICATIONS USED:  MARCAINE     SPECIMEN:  No Specimen  DISPOSITION OF SPECIMEN:  N/A  COUNTS:  YES  TOURNIQUET: Tourniquet applied but not used during the procedure  IMPLANTS: None  DICTATION: .Dragon Dictation patient was brought the operating room and after adequate general anesthesia was obtained the right leg was prepped and draped in the usual sterile fashion.  After patient identification and timeout procedure with the leg in the leg holder with an tourniquet applied an inferior lateral portals made and the arthroscope was introduced.  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  Coming out of the medial compartment inferior medial portal was made and the probe introduced on probing there was a complex tear to most of the posterior third of the meniscus there is a approximately centimeter area in the weightbearing aspect of the distal femur with loss of almost all cartilage with an adjacent lesion on the tibia.  Going to the ACL was intact and lateral compartment showed some fibrillation of the cartilage but no exposed bone or partial thickness loss.  The gutters were free of any loose bodies.  Meniscal shaver and ArthroCare wand were used to ablate and debride tissue of the posterior horn  tear until was back to a stable marked rim margin.  After this was completed the knee was thoroughly irrigated with pre and post procedure pictures having been obtained.  All instrumentation was withdrawn and the wounds infiltrated with 10 cc half percent Sensorcaine with epinephrine in the area of the each portal followed by closure with simple interrupted 5-0 nylon skin sutures.  Xeroform 4 x 4 web roll and Ace wrap applied.  PLAN OF CARE: Discharge to home after PACU  PATIENT DISPOSITION:  PACU - hemodynamically stable.

## 2020-12-16 ENCOUNTER — Encounter: Payer: Self-pay | Admitting: Orthopedic Surgery

## 2020-12-28 NOTE — Anesthesia Postprocedure Evaluation (Signed)
Anesthesia Post Note  Patient: Kerry Graham  Procedure(s) Performed: Right knee arthroscopy, partial medial meniscectomy (Right Knee)  Patient location during evaluation: PACU Anesthesia Type: General Level of consciousness: awake and alert Pain management: pain level controlled Vital Signs Assessment: post-procedure vital signs reviewed and stable Respiratory status: spontaneous breathing, nonlabored ventilation, respiratory function stable and patient connected to nasal cannula oxygen Cardiovascular status: blood pressure returned to baseline and stable Postop Assessment: no apparent nausea or vomiting Anesthetic complications: no   No complications documented.   Last Vitals:  Vitals:   12/15/20 1656 12/15/20 1715  BP: (!) 123/99 127/64  Pulse: 69   Resp: 16 16  Temp: 37 C 37 C  SpO2: 100% 100%    Last Pain:  Vitals:   12/15/20 1715  TempSrc: Tympanic  PainSc:                  Molli Barrows

## 2022-04-14 IMAGING — MR MR KNEE*R* W/O CM
6 series · 40 of 40 positions shown · non-contrast
Comparison: None.

CLINICAL DATA: Right knee pain since the patient suffered a fall
secondary to dehydration in May 2020.

EXAM:
MRI OF THE RIGHT KNEE WITHOUT CONTRAST
TECHNIQUE: Multiplanar, multisequence MR imaging of the knee was performed. No
intravenous contrast was administered.

[Series 8: T2 fat-sat · axial · right · 4.0mm · 0.50mm/px · z∈[-66,+59]mm · 6 of 26 slices shown (1 of 3)]
[im 1/26]
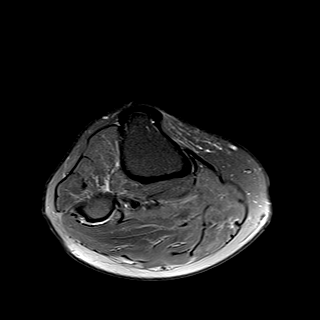
[im 6/26]
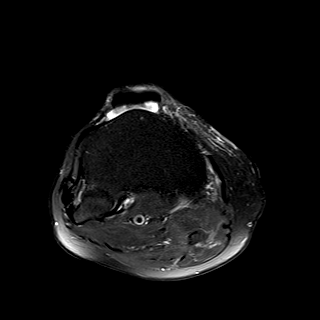
[im 11/26]
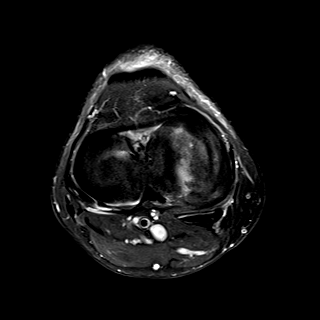
[im 16/26]
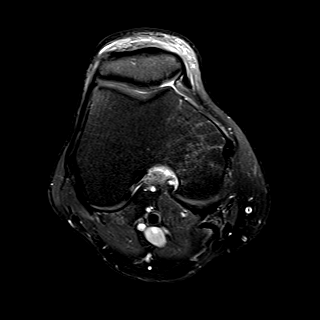
[im 21/26]
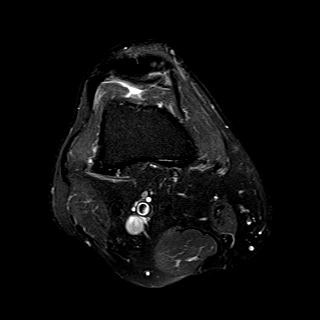
[im 26/26]
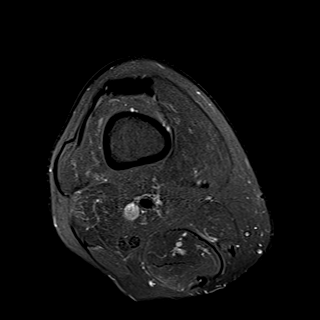

[Series 9: T2 fat-sat · coronal · right · 4.0mm · 0.59mm/px · 7 of 26 slices shown (2 of 3)]
[im 1/26]
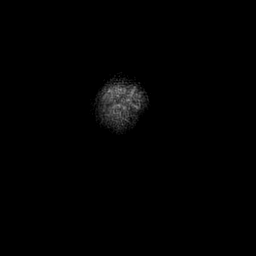
[im 5/26]
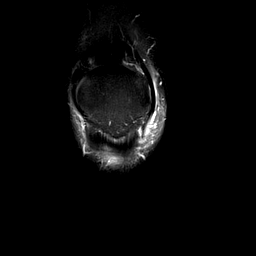
[im 9/26]
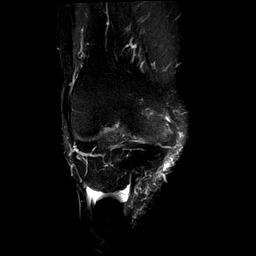
[im 13/26]
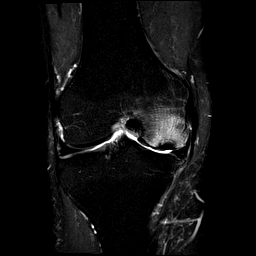
[im 17/26]
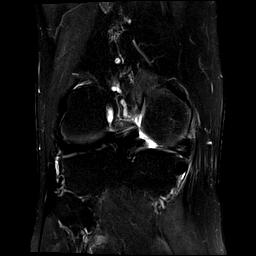
[im 21/26]
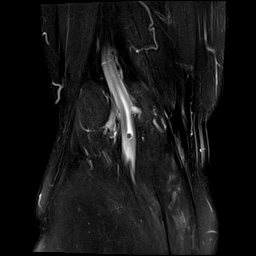
[im 26/26]
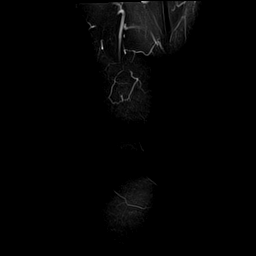

[Series 10: T1 · coronal · right · 4.0mm · 0.59mm/px · 7 of 26 slices shown]
[im 1/26]
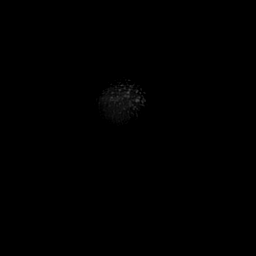
[im 5/26]
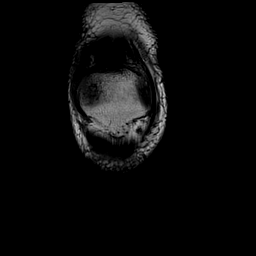
[im 9/26]
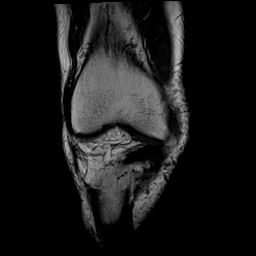
[im 13/26]
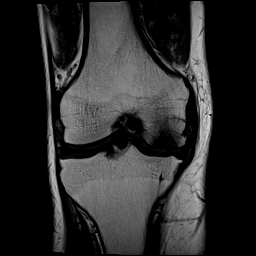
[im 17/26]
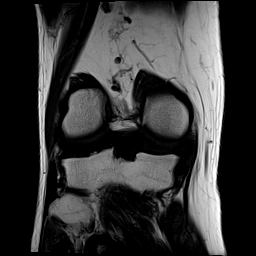
[im 21/26]
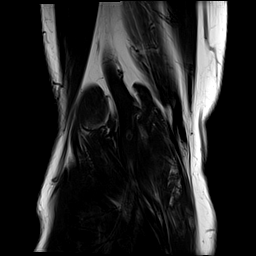
[im 26/26]
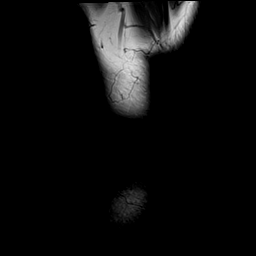

[Series 11: PD fat-sat · coronal · right · 4.0mm · 0.59mm/px · 7 of 26 slices shown (1 of 2)]
[im 1/26]
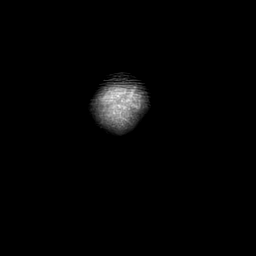
[im 5/26]
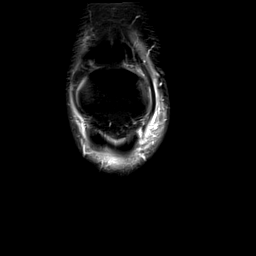
[im 9/26]
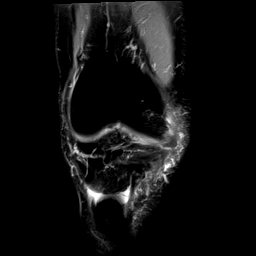
[im 13/26]
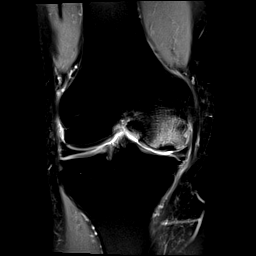
[im 17/26]
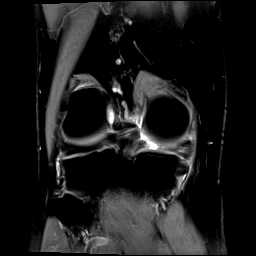
[im 21/26]
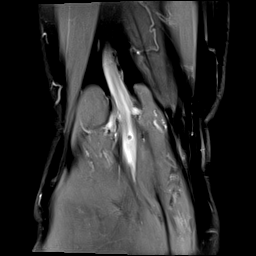
[im 26/26]
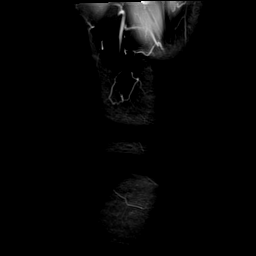

[Series 12: PD fat-sat · sagittal · right · 3.0mm · 0.59mm/px · 6 of 25 slices shown (2 of 2)]
[im 1/25]
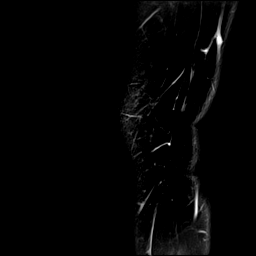
[im 5/25]
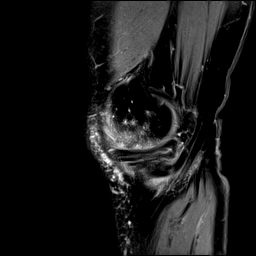
[im 10/25]
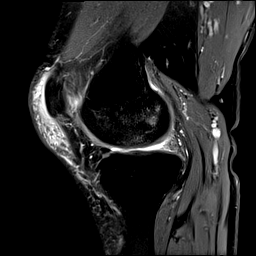
[im 15/25]
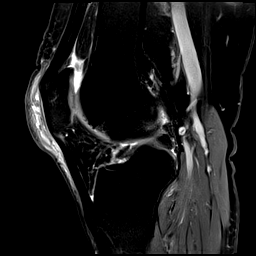
[im 20/25]
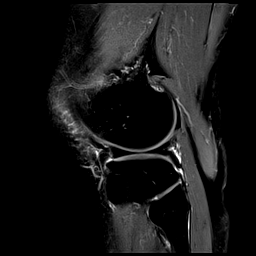
[im 25/25]
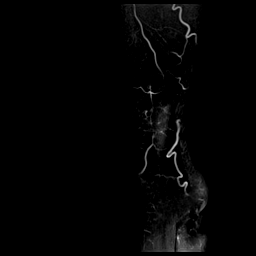

[Series 13: T2 fat-sat · sagittal · right · 3.0mm · 0.59mm/px · 7 of 26 slices shown (3 of 3)]
[im 1/26]
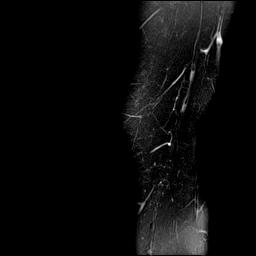
[im 5/26]
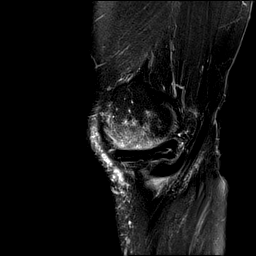
[im 9/26]
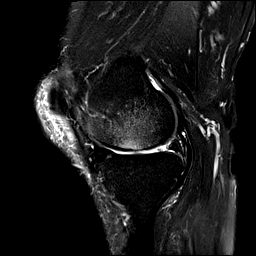
[im 13/26]
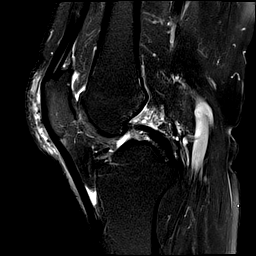
[im 17/26]
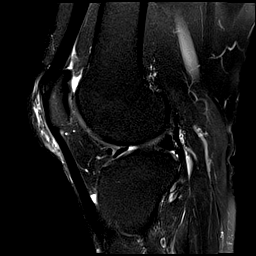
[im 21/26]
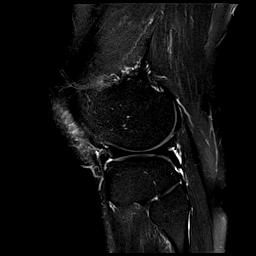
[im 26/26]
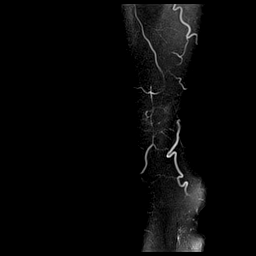

[40 of 40 positions shown; findings below may reference images not displayed]

FINDINGS: MENISCI

Medial meniscus: Complex tear in the posterior horn includes a large
radial component peripheral to the meniscal root. The root is
severely degenerated. Extensive intrasubstance degenerative signal
is seen in the body.

Lateral meniscus:  Intact.

LIGAMENTS

Cruciates:  Intact.

Collaterals:  Intact.

CARTILAGE

Patellofemoral: Cartilage thinning is seen along the medial patellar
facet.

Medial:  Moderately degenerated without focal defect.

Lateral:  Preserved.

Joint:  Small effusion.

Popliteal Fossa:  No Baker's cyst.

Extensor Mechanism:  Intact.

Bones: There is a fracture of the subchondral bone plate of the
central weight-bearing medial femoral condyle with associated marrow
edema. The fractured area measures 1.4 cm transverse by 2.1 cm AP.
There is flattening and mild concavity of the subchondral bone plate
due to the fracture.

Other: There is fluid in the prepatellar bursa.
IMPRESSION: Complex tear of the posterior horn the medial meniscus includes a
large radial component peripheral to the meniscal root. The root of
the posterior horn appears severely degenerated.

Acute or subacute fracture of the subchondral bone plate of the
weight-bearing medial femoral condyle. There is flattening and mild
concavity of subchondral bone due to the fracture.

Mild to moderate osteoarthritis most notable in the medial
compartment.

Prepatellar bursitis.

## 2024-04-12 ENCOUNTER — Emergency Department

## 2024-04-12 ENCOUNTER — Emergency Department
Admission: EM | Admit: 2024-04-12 | Discharge: 2024-04-13 | Disposition: A | Discharge status: Other Acute Inpatient Hospital

## 2024-04-12 ENCOUNTER — Other Ambulatory Visit: Payer: Self-pay

## 2024-04-12 ENCOUNTER — Encounter: Payer: Self-pay | Admitting: Emergency Medicine

## 2024-04-12 DIAGNOSIS — I4891 Unspecified atrial fibrillation: Secondary | ICD-10-CM | POA: Diagnosis not present

## 2024-04-12 DIAGNOSIS — I2510 Atherosclerotic heart disease of native coronary artery without angina pectoris: Secondary | ICD-10-CM | POA: Insufficient documentation

## 2024-04-12 DIAGNOSIS — I119 Hypertensive heart disease without heart failure: Secondary | ICD-10-CM | POA: Insufficient documentation

## 2024-04-12 DIAGNOSIS — Z79899 Other long term (current) drug therapy: Secondary | ICD-10-CM | POA: Insufficient documentation

## 2024-04-12 DIAGNOSIS — R008 Other abnormalities of heart beat: Secondary | ICD-10-CM | POA: Insufficient documentation

## 2024-04-12 DIAGNOSIS — E785 Hyperlipidemia, unspecified: Secondary | ICD-10-CM | POA: Insufficient documentation

## 2024-04-12 DIAGNOSIS — R002 Palpitations: Secondary | ICD-10-CM | POA: Diagnosis present

## 2024-04-12 DIAGNOSIS — Z7901 Long term (current) use of anticoagulants: Secondary | ICD-10-CM | POA: Diagnosis not present

## 2024-04-12 DIAGNOSIS — I10 Essential (primary) hypertension: Secondary | ICD-10-CM | POA: Insufficient documentation

## 2024-04-12 DIAGNOSIS — I471 Supraventricular tachycardia, unspecified: Secondary | ICD-10-CM | POA: Insufficient documentation

## 2024-04-12 DIAGNOSIS — Z951 Presence of aortocoronary bypass graft: Secondary | ICD-10-CM | POA: Insufficient documentation

## 2024-04-12 DIAGNOSIS — I499 Cardiac arrhythmia, unspecified: Secondary | ICD-10-CM | POA: Insufficient documentation

## 2024-04-12 LAB — BASIC METABOLIC PANEL WITH GFR
Anion gap: 10 (ref 5–15)
BUN: 15 mg/dL (ref 8–23)
CO2: 24 mmol/L (ref 22–32)
Calcium: 8.6 mg/dL — ABNORMAL LOW (ref 8.9–10.3)
Chloride: 102 mmol/L (ref 98–111)
Creatinine, Ser: 1.12 mg/dL (ref 0.61–1.24)
GFR, Estimated: >60 mL/min (ref >60)
Glucose, Bld: 189 mg/dL — ABNORMAL HIGH (ref 70–99)
Potassium: 4 mmol/L (ref 3.5–5.1)
Sodium: 136 mmol/L (ref 135–145)

## 2024-04-12 LAB — CBC WITH DIFFERENTIAL/PLATELET
Abs Immature Granulocytes: 0.02 K/uL (ref 0.00–0.07)
Basophils Absolute: 0.1 K/uL (ref 0.0–0.1)
Basophils Relative: 1 %
Eosinophils Absolute: 0.3 K/uL (ref 0.0–0.5)
Eosinophils Relative: 3 %
HCT: 40.1 % (ref 39.0–52.0)
Hemoglobin: 13.2 g/dL (ref 13.0–17.0)
Immature Granulocytes: 0 %
Lymphocytes Relative: 24 %
Lymphs Abs: 2.2 K/uL (ref 0.7–4.0)
MCH: 31.7 pg (ref 26.0–34.0)
MCHC: 32.9 g/dL (ref 30.0–36.0)
MCV: 96.2 fL (ref 80.0–100.0)
Monocytes Absolute: 0.9 K/uL (ref 0.1–1.0)
Monocytes Relative: 10 %
Neutro Abs: 5.7 K/uL (ref 1.7–7.7)
Neutrophils Relative %: 62 %
Platelets: 234 K/uL (ref 150–400)
RBC: 4.17 MIL/uL — ABNORMAL LOW (ref 4.22–5.81)
RDW: 13.6 % (ref 11.5–15.5)
WBC: 9.2 K/uL (ref 4.0–10.5)
nRBC: 0 % (ref 0.0–0.2)

## 2024-04-12 LAB — PROTIME-INR
INR: 1.2 (ref 0.8–1.2)
Prothrombin Time: 16.4 s — ABNORMAL HIGH (ref 11.4–15.2)

## 2024-04-12 LAB — BRAIN NATRIURETIC PEPTIDE: B Natriuretic Peptide: 458 pg/mL — ABNORMAL HIGH (ref 0.0–100.0)

## 2024-04-12 LAB — TROPONIN I (HIGH SENSITIVITY)
Troponin I (High Sensitivity): 12 ng/L (ref <18)
Troponin I (High Sensitivity): 11 ng/L (ref <18)

## 2024-04-12 MED ORDER — AMIODARONE HCL IN DEXTROSE 360-4.14 MG/200ML-% IV SOLN
60.0000 mg/h | INTRAVENOUS | Status: AC
  Administered 2024-04-12: 60 mg/h via INTRAVENOUS
  Filled 2024-04-12: qty 200

## 2024-04-12 MED ORDER — FENTANYL CITRATE PF 50 MCG/ML IJ SOSY
50.0000 ug | PREFILLED_SYRINGE | Freq: Once | INTRAMUSCULAR | Status: DC
  Filled 2024-04-12: qty 1

## 2024-04-12 MED ORDER — AMIODARONE LOAD VIA INFUSION
150.0000 mg | Freq: Once | INTRAVENOUS | Status: AC
  Administered 2024-04-12: 150 mg via INTRAVENOUS

## 2024-04-12 MED ORDER — ADENOSINE 6 MG/2ML IV SOLN
6.0000 mg | Freq: Once | INTRAVENOUS | Status: AC
  Administered 2024-04-12: 6 mg via INTRAVENOUS
  Filled 2024-04-12: qty 2

## 2024-04-12 MED ORDER — AMIODARONE HCL IN DEXTROSE 360-4.14 MG/200ML-% IV SOLN
30.0000 mg/h | INTRAVENOUS | Status: DC
  Administered 2024-04-12 – 2024-04-13 (×2): 30 mg/h via INTRAVENOUS
  Filled 2024-04-12 (×2): qty 200

## 2024-04-12 MED ORDER — AMIODARONE IV BOLUS ONLY 150 MG/100ML
150.0000 mg | Freq: Once | INTRAVENOUS | Status: DC
  Filled 2024-04-12: qty 100

## 2024-04-12 NOTE — ED Notes (Addendum)
 Pt assisted onto bedside commode. Pt had bowel movement.

## 2024-04-12 NOTE — ED Notes (Signed)
 Pt took eliquis  from home supply after okay from Dr. Clarine

## 2024-04-12 NOTE — ED Notes (Signed)
 Just called [facility] for follow-up bed for this patient still no bed available yet.

## 2024-04-12 NOTE — ED Notes (Signed)
Pt given box meal

## 2024-04-12 NOTE — ED Notes (Signed)
 Pt is CAOx4, breathing normally, and normal in color. Pt reports feeling dizzy this morning and reports hx of Afib that may be the cause. Pt reports feeling much better since EMS medicated him, and has not complaints of being symptomatic at this time. Pt's family at bedside with pt. PT in NAD and denies any needs.

## 2024-04-12 NOTE — ED Provider Notes (Signed)
 [facility] Provider Note    Event Date/Time   First MD Initiated Contact with Patient 04/12/24 [phone]     (approximate)   History   Atrial Fibrillation  Pt via [facility] from home. Pt having dizziness and vision changes that started this AM, reports that the symptoms have subsided. EMS initial HR of 220s, pt has a hx of a fib and takes Metoprolol  and Eliquis . Pt has not taken his Eliquis  this morning. Reports he had to be cardioverted last Wednesday at [facility] and pending to schedule for an ablation. HR on arrival is 120s. Pt is A&Ox4 and NAD   HPI Kerry Graham is a 83 y.o. male PMH A-fib status post ablation with recurrence on Eliquis , CAD with prior CABG, hypertension, hyperlipidemia presents for evaluation of palpitation/dizziness -Per EMS, patient started having palpitations and dizziness this morning, called his daughter who then called an ambulance.  Has had multiple cardioversions previously, is planned for an ablation, intervention date pending.  Initial EKG showing apparent A-fib RVR with a rate of about 116, received total of 25 mg IV diltiazem and converted to sinus rhythm.  Blood pressures have been stable. - On my evaluation, patient states he started feeling dizzy/lightheaded this morning around 7 AM while they were getting ready from church.  Was preceded by some diarrhea.  No black or bloody stools.  No fever.  No chest pain or shortness of breath.  Has otherwise been in his usual state of health. - He is followed closely by cardiology in the [facility] health system.  Per my chart review, last cardiology clinic visit on 04/06/2024.  Noted to have had cardioversion on 03/13/2024, noted to have recurrence since 03/31/2024 that was symptomatic, scheduled for outpatient cardioversion which was performed on 04/08/2024.  Currently on Eliquis  and metoprolol , had previously been on amiodarone  and dofetilide.  Had recurrence of arrhythmia on dofetilide.  Appears amiodarone  was  discontinued after his cardioversion in June. - He is taking all of his medications as prescribed, last Eliquis  dose yesterday evening.      Physical Exam   Triage Vital Signs: BP 113/64 (BP Location: Right Arm)   Pulse 65   Temp 98.9 F (37.2 C) (Oral)   Resp 16   Ht 5' 7.5 (1.715 m)   Wt 79.7 kg   SpO2 98%   BMI 27.10 kg/m    Most recent vital signs: Vitals:   04/12/24 1230 04/12/24 1345  BP: 104/73 113/64  Pulse: 65 65  Resp: 20 16  Temp:  98.9 F (37.2 C)  SpO2: 99% 98%     General: Awake, no distress.  CV:  Good peripheral perfusion.  Tachycardic, regular rhythm, RP 2+ Resp:  Normal effort. CTAB Abd:  No distention. Nontender to deep palpation throughout   ED Results / Procedures / Treatments   Labs (all labs ordered are listed, but only abnormal results are displayed) Labs Reviewed  BASIC METABOLIC PANEL WITH GFR - Abnormal; Notable for the following components:      Result Value   Glucose, Bld 189 (*)    Calcium  8.6 (*)    All other components within normal limits  CBC WITH DIFFERENTIAL/PLATELET - Abnormal; Notable for the following components:   RBC 4.17 (*)    All other components within normal limits  PROTIME-INR - Abnormal; Notable for the following components:   Prothrombin Time 16.4 (*)    All other components within normal limits  BRAIN NATRIURETIC PEPTIDE - Abnormal; Notable for  the following components:   B Natriuretic Peptide 458.0 (*)    All other components within normal limits  TROPONIN I (HIGH SENSITIVITY)  TROPONIN I (HIGH SENSITIVITY)     EKG  Ecg = A-fib versus sinus tach with bigeminy, rate 124, no gross ST elevation or depression except for trace depressions diffusely that I suspect are rate dependent, no significant repolarization abnormality, left axis deviation.  No clear evidence of acute ischemia on my interpretation.   RADIOLOGY Radiology interpreted myself and radiology report reviewed.  No acute pathology  identified.    PROCEDURES:  Critical Care performed: Yes, see critical care procedure note(s)  Procedures   MEDICATIONS ORDERED IN ED: Medications  amiodarone  (NEXTERONE  PREMIX) 360-4.14 MG/200ML-% (1.8 mg/mL) IV infusion (0 mg/hr Intravenous Stopped 04/12/24 1303)    Followed by  amiodarone  (NEXTERONE  PREMIX) 360-4.14 MG/200ML-% (1.8 mg/mL) IV infusion (30 mg/hr Intravenous New Bag/Given 04/12/24 1417)  amiodarone  (NEXTERONE ) 1.5 mg/mL IV bolus only 150 mg (150 mg Intravenous Not Given 04/12/24 0930)  fentaNYL  (SUBLIMAZE ) injection 50 mcg (has no administration in time range)  adenosine  (ADENOCARD ) 6 MG/2ML injection 6 mg (6 mg Intravenous Given 04/12/24 0929)  amiodarone  (NEXTERONE ) 1.8 mg/mL load via infusion 150 mg (150 mg Intravenous Bolus from Bag 04/12/24 0927)     IMPRESSION / MDM / ASSESSMENT AND PLAN / ED COURSE  I reviewed the triage vital signs and the nursing notes.                              DDX/MDM/AP: Differential diagnosis includes, but is not limited to, likely primary cardiac arrhythmia, no clear history or findings to suggest underlying infection, metabolic abnormality, anemia.  Doubt ACS, do not clinically suspect PE in this largely symptomatic and already anticoagulated patient.  Pressures are tenuous here in emergency department and at time of my evaluation he is tachycardic to the 170s with heart rates  Plan: - Labs - Cardiac monitor - EKG - Chest x-ray - IV fluid   Patient's presentation is most consistent with acute presentation with potential threat to life or bodily function.  The patient is on the cardiac monitor to evaluate for evidence of arrhythmia and/or significant heart rate changes.  ED course below.  Patient initially with A-fib RVR, borderline hypotensive, started on amiodarone  bolus and drip, in this process developed SVT and became hypotensive to the 60s, fortunately did respond very well to single dose of adenosine , converted to normal  sinus rhythm, blood pressure normalized, able to avoid electrical cardioversion.  Laboratory workup overall unremarkable beyond mild-moderate elevation of BNP, no florid evidence of CHF.  Given patient's notable arrhythmias here with hypotension, I do believe he is best served as inpatient for further monitoring and cardiac evaluation.  Given patient is deeply established at [facility] with the cardiology service, family did request transfer to that facility if possible--accepted, bed pending.  Signed out to oncoming ED provider pending transfer.  Remains in normal sinus rhythm here on amiodarone  drip, asymptomatic.  Troponin normal.  Clinical Course as of 04/12/24 1511  Sun Apr 12, 2024  0919 Cxr: IMPRESSION: Cardiomegaly without acute abnormality of the lungs.   [MM]  0919 CBC, BMP reviewed, unremarkable [MM]  [phone] Patient developed SVT here in emergency department, became hypotensive to systolic of 60s though maintained mentation.  Responded very well to single dose of 6 mg IV adenosine , fortunately did not require electrical cardioversion, return to normal sinus rhythm, blood pressure  improved to systolic in the low 100s  Given patient has primarily received cardiology services at [facility], family is requesting transfer if possible.  Otherwise they are amenable to admission at our facility. [MM]  [phone] ED secretary contacting [facility] to discuss possible transfer [MM]  [phone] D/w [facility] transfer center, will call us  back shortly [MM]  1042 Patient accepted for transfer to [facility], accepting physician Dr. Franky Mace  Awaiting bed assignment [MM]    Clinical Course User Index [MM] Clarine Ozell LABOR, MD     FINAL CLINICAL IMPRESSION(S) / ED DIAGNOSES   Final diagnoses:  Atrial fibrillation with rapid ventricular response (HCC)  SVT (supraventricular tachycardia) (HCC)     Rx / DC Orders   ED Discharge Orders     None        Note:  This document was prepared using Dragon  voice recognition software and may include unintentional dictation errors.   Clarine Ozell LABOR, MD 04/12/24 [phone]

## 2024-04-12 NOTE — ED Notes (Signed)
 Consent to transfer completed

## 2024-04-12 NOTE — ED Notes (Signed)
 Pt and family updated on bed status at [facility]. Bed still not assigned.

## 2024-04-12 NOTE — ED Notes (Signed)
 Called CCMD and added pt to board.

## 2024-04-12 NOTE — ED Triage Notes (Signed)
 Pt via [facility] from home. Pt having dizziness and vision changes that started this AM, reports that the symptoms have subsided. EMS initial HR of 220s, pt has a hx of a fib and takes Metoprolol  and Eliquis . Pt has not taken his Eliquis  this morning. Reports he had to be cardioverted last Wednesday at [facility] and pending to schedule for an ablation. HR on arrival is 120s. Pt is A&Ox4 and NAD

## 2024-04-12 NOTE — ED Notes (Signed)
 Called to check on bed status per rep to bed available

## 2024-04-12 NOTE — ED Notes (Signed)
 Pt and family updated on plan of care/transfer. Pt and family denies any needs.

## 2024-04-13 DIAGNOSIS — I4891 Unspecified atrial fibrillation: Secondary | ICD-10-CM | POA: Diagnosis not present

## 2024-04-13 MED ORDER — APIXABAN 5 MG PO TABS
5.0000 mg | ORAL_TABLET | Freq: Two times a day (BID) | ORAL | Status: DC
  Administered 2024-04-13: 5 mg via ORAL
  Filled 2024-04-13: qty 1

## 2024-04-13 MED ORDER — METOPROLOL SUCCINATE ER 50 MG PO TB24
50.0000 mg | ORAL_TABLET | Freq: Every day | ORAL | Status: DC
  Administered 2024-04-13: 50 mg via ORAL
  Filled 2024-04-13: qty 1

## 2024-04-13 NOTE — ED Notes (Signed)
 Pt assisted to standing to use urinal and back into bed. No other needs at this time. Call bell tied to bed railing within reach.

## 2024-04-13 NOTE — ED Provider Notes (Signed)
 Care assumed of patient from outgoing provider.  See their note for initial history, exam and plan.  Clinical Course as of 04/13/24 9266  Kerry Graham Apr 12, 2024  0919 Cxr: IMPRESSION: Cardiomegaly without acute abnormality of the lungs.   [MM]  0919 CBC, BMP reviewed, unremarkable [MM]  [phone] Patient developed SVT here in emergency department, became hypotensive to systolic of 60s though maintained mentation.  Responded very well to single dose of 6 mg IV adenosine , fortunately did not require electrical cardioversion, return to normal sinus rhythm, blood pressure improved to systolic in the low 100s  Given patient has primarily received cardiology services at [facility], family is requesting transfer if possible.  Otherwise they are amenable to admission at our facility. [MM]  [phone] ED secretary contacting [facility] to discuss possible transfer [MM]  [phone] D/w [facility] transfer center, will call us  back shortly [MM]  1042 Patient accepted for transfer to [facility], accepting physician Dr. Franky Mace  Awaiting bed assignment [MM]  Mon Apr 13, 2024  0715 CV on arrival for afib on amio drip - plan to transfer to [facility] since h/o of ablation and team is at [facility]. BP meds held since low BP on arrival. Restarted metop and eliquis . Waiting for ready bed. No intervention overnight.  [SM]    Clinical Course User Index [MM] Clarine Ozell LABOR, MD [SM] Suzanne Kirsch, MD     Suzanne Kirsch, MD 04/13/24 [phone]

## 2024-04-13 NOTE — ED Notes (Signed)
 Attempted to call report x3 to [facility] without success. Numbers continuously ring and then hang up automatically. Secretary informed of need of new number to give report to oncoming patient

## 2024-06-22 ENCOUNTER — Emergency Department

## 2024-06-22 ENCOUNTER — Other Ambulatory Visit: Payer: Self-pay

## 2024-06-22 ENCOUNTER — Observation Stay
Admission: EM | Admit: 2024-06-22 | Discharge: 2024-06-23 | DRG: 281 | Disposition: A | Discharge status: Home / Self Care | Attending: Internal Medicine | Admitting: Internal Medicine

## 2024-06-22 DIAGNOSIS — I214 Non-ST elevation (NSTEMI) myocardial infarction: Principal | ICD-10-CM | POA: Diagnosis present

## 2024-06-22 DIAGNOSIS — E538 Deficiency of other specified B group vitamins: Secondary | ICD-10-CM | POA: Diagnosis present

## 2024-06-22 DIAGNOSIS — I10 Essential (primary) hypertension: Secondary | ICD-10-CM | POA: Diagnosis not present

## 2024-06-22 DIAGNOSIS — I471 Supraventricular tachycardia, unspecified: Principal | ICD-10-CM | POA: Diagnosis present

## 2024-06-22 DIAGNOSIS — R0789 Other chest pain: Secondary | ICD-10-CM | POA: Diagnosis present

## 2024-06-22 DIAGNOSIS — Z7901 Long term (current) use of anticoagulants: Secondary | ICD-10-CM | POA: Diagnosis not present

## 2024-06-22 DIAGNOSIS — Z951 Presence of aortocoronary bypass graft: Secondary | ICD-10-CM

## 2024-06-22 DIAGNOSIS — Z79899 Other long term (current) drug therapy: Secondary | ICD-10-CM

## 2024-06-22 DIAGNOSIS — E785 Hyperlipidemia, unspecified: Secondary | ICD-10-CM | POA: Diagnosis not present

## 2024-06-22 DIAGNOSIS — I4892 Unspecified atrial flutter: Secondary | ICD-10-CM | POA: Diagnosis present

## 2024-06-22 DIAGNOSIS — I480 Paroxysmal atrial fibrillation: Secondary | ICD-10-CM | POA: Diagnosis not present

## 2024-06-22 DIAGNOSIS — I2510 Atherosclerotic heart disease of native coronary artery without angina pectoris: Secondary | ICD-10-CM | POA: Diagnosis present

## 2024-06-22 LAB — PROTIME-INR
INR: 1.3 — ABNORMAL HIGH (ref 0.8–1.2)
Prothrombin Time: 16.6 s — ABNORMAL HIGH (ref 11.4–15.2)

## 2024-06-22 LAB — CBC WITH DIFFERENTIAL/PLATELET
Abs Immature Granulocytes: 0.03 K/uL (ref 0.00–0.07)
Basophils Absolute: 0.1 K/uL (ref 0.0–0.1)
Basophils Relative: 1 %
Eosinophils Absolute: 0.2 K/uL (ref 0.0–0.5)
Eosinophils Relative: 2 %
HCT: 37.9 % — ABNORMAL LOW (ref 39.0–52.0)
Hemoglobin: 12.5 g/dL — ABNORMAL LOW (ref 13.0–17.0)
Immature Granulocytes: 0 %
Lymphocytes Relative: 21 %
Lymphs Abs: 2 K/uL (ref 0.7–4.0)
MCH: 32 pg (ref 26.0–34.0)
MCHC: 33 g/dL (ref 30.0–36.0)
MCV: 96.9 fL (ref 80.0–100.0)
Monocytes Absolute: 1 K/uL (ref 0.1–1.0)
Monocytes Relative: 11 %
Neutro Abs: 6.2 K/uL (ref 1.7–7.7)
Neutrophils Relative %: 65 %
Platelets: 272 K/uL (ref 150–400)
RBC: 3.91 MIL/uL — ABNORMAL LOW (ref 4.22–5.81)
RDW: 13.8 % (ref 11.5–15.5)
WBC: 9.4 K/uL (ref 4.0–10.5)
nRBC: 0 % (ref 0.0–0.2)

## 2024-06-22 LAB — COMPREHENSIVE METABOLIC PANEL WITH GFR
ALT: 18 U/L (ref 0–44)
AST: 38 U/L (ref 15–41)
Albumin: 3.6 g/dL (ref 3.5–5.0)
Alkaline Phosphatase: 58 U/L (ref 38–126)
Anion gap: 11 (ref 5–15)
BUN: 20 mg/dL (ref 8–23)
CO2: 24 mmol/L (ref 22–32)
Calcium: 8.5 mg/dL — ABNORMAL LOW (ref 8.9–10.3)
Chloride: 99 mmol/L (ref 98–111)
Creatinine, Ser: 1.18 mg/dL (ref 0.61–1.24)
GFR, Estimated: >60 mL/min (ref >60)
Glucose, Bld: 146 mg/dL — ABNORMAL HIGH (ref 70–99)
Potassium: 4.6 mmol/L (ref 3.5–5.1)
Sodium: 134 mmol/L — ABNORMAL LOW (ref 135–145)
Total Bilirubin: 1.3 mg/dL — ABNORMAL HIGH (ref 0.0–1.2)
Total Protein: 7.1 g/dL (ref 6.5–8.1)

## 2024-06-22 LAB — TROPONIN I (HIGH SENSITIVITY)
Troponin I (High Sensitivity): 15 ng/L (ref <18)
Troponin I (High Sensitivity): 223 ng/L (ref <18)
Troponin I (High Sensitivity): 805 ng/L (ref <18)

## 2024-06-22 LAB — HEPARIN LEVEL (UNFRACTIONATED): Heparin Unfractionated: >1.1 [IU]/mL — ABNORMAL HIGH (ref 0.30–0.70)

## 2024-06-22 LAB — APTT: aPTT: 37 s — ABNORMAL HIGH (ref 24–36)

## 2024-06-22 MED ORDER — NITROGLYCERIN 0.4 MG SL SUBL: 0.4000 mg | SUBLINGUAL_TABLET | SUBLINGUAL | Status: DC | PRN

## 2024-06-22 MED ORDER — HEPARIN BOLUS VIA INFUSION
1500.0000 [IU] | Freq: Once | INTRAVENOUS | Status: AC
  Administered 2024-06-22: 1500 [IU] via INTRAVENOUS
  Filled 2024-06-22: qty 1500

## 2024-06-22 MED ORDER — HEPARIN (PORCINE) 25000 UT/250ML-% IV SOLN
1250.0000 [IU]/h | INTRAVENOUS | Status: DC
  Administered 2024-06-22: 900 [IU]/h via INTRAVENOUS
  Filled 2024-06-22: qty 1000

## 2024-06-22 MED ORDER — ASPIRIN 300 MG RE SUPP
300.0000 mg | RECTAL | Status: AC
Start: 2024-06-22 — End: 2024-06-23

## 2024-06-22 MED ORDER — RAMIPRIL 5 MG PO CAPS
10.0000 mg | ORAL_CAPSULE | Freq: Every day | ORAL | Status: DC
  Administered 2024-06-23: 10 mg via ORAL
  Filled 2024-06-22: qty 2

## 2024-06-22 MED ORDER — ACETAMINOPHEN 325 MG PO TABS: 650.0000 mg | ORAL_TABLET | ORAL | Status: DC | PRN

## 2024-06-22 MED ORDER — ONDANSETRON HCL 4 MG/2ML IJ SOLN: 4.0000 mg | Freq: Four times a day (QID) | INTRAMUSCULAR | Status: DC | PRN

## 2024-06-22 MED ORDER — METOPROLOL SUCCINATE ER 50 MG PO TB24
50.0000 mg | ORAL_TABLET | Freq: Two times a day (BID) | ORAL | Status: DC
Start: 2024-06-23 — End: 2024-06-23
  Administered 2024-06-23: 50 mg via ORAL
  Filled 2024-06-22: qty 1

## 2024-06-22 MED ORDER — ASPIRIN 81 MG PO TBEC
81.0000 mg | DELAYED_RELEASE_TABLET | Freq: Every day | ORAL | Status: DC
  Administered 2024-06-23: 81 mg via ORAL
  Filled 2024-06-22: qty 1

## 2024-06-22 MED ORDER — ATORVASTATIN CALCIUM 80 MG PO TABS
80.0000 mg | ORAL_TABLET | Freq: Every day | ORAL | Status: DC
  Administered 2024-06-23: 80 mg via ORAL
  Filled 2024-06-22: qty 4

## 2024-06-22 MED ORDER — ASPIRIN 81 MG PO CHEW
324.0000 mg | CHEWABLE_TABLET | ORAL | Status: AC
Start: 2024-06-22 — End: 2024-06-23
  Administered 2024-06-23: 324 mg via ORAL
  Filled 2024-06-22: qty 4

## 2024-06-22 NOTE — ED Notes (Signed)
Pt given sandwich tray at this time  

## 2024-06-22 NOTE — Progress Notes (Signed)
 PHARMACY - ANTICOAGULATION CONSULT NOTE  Pharmacy Consult for heparin  Indication: chest pain/ACS  No Known Allergies  Patient Measurements: Height: 5' 7 (170.2 cm) Weight: 76.2 kg (168 lb) IBW/kg (Calculated) : 66.1 HEPARIN  DW (KG): 76.2  Vital Signs: Temp: 97.8 F (36.6 C) (09/22 1745) Temp Source: Oral (09/22 1745) BP: 109/55 (09/22 1830) Pulse Rate: 73 (09/22 1830)  Labs: Recent Labs    06/22/24 1741 06/22/24 1948  HGB 12.5*  --   HCT 37.9*  --   PLT 272  --   CREATININE 1.18  --   TROPONINIHS 15 223*    Estimated Creatinine Clearance: 45.9 mL/min (by C-G formula based on SCr of 1.18 mg/dL).   Medical History: Past Medical History:  Diagnosis Date   Atrial fibrillation (HCC)    Chronic anticoagulation    Coronary artery disease    Hx of CABG 2001   Hyperlipidemia    Hypertension    Vitamin B 12 deficiency    Assessment: 83 y/o Kerry Graham presenting with chest pain, elevated troponins. PMH significant for atrial fibrillation on Eliquis , SVT status post ablation in July of this year, CAD, CABG, hypertension, hyperlipidemia. Pharmacy has been consulted to initiate and manage heparin  infusion.  Last dose Eliquis : 06/22/24 @ 0830 Baseline labs: heparin  level/aPTT/INR ordered, hgb 12.5, plt 272  Goal of Therapy:  Heparin  level 0.3-0.7 units/ml aPTT 66-102 seconds Monitor platelets by anticoagulation protocol: Yes   Plan:  Give partial heparin  bolus of 1500 units  Start heparin  infusion at 900 units/hr Continue to monitor H&H and platelets Will monitor using aPTT levels until correlating with heparin  levels, then switch to using heparin  levels only  Thank you for involving pharmacy in this patient's care.   Damien Napoleon, PharmD Clinical Pharmacist 06/22/2024 9:26 PM

## 2024-06-22 NOTE — ED Notes (Signed)
 Fall precautions initiated. Fall risk armband on pt, nonskid shoes, pt declined bed alarm as they ambulate without assistance at baseline.

## 2024-06-22 NOTE — H&P (Incomplete)
 History and Physical    Patient: Kerry Graham FMW:[id number] DOB: [birth year] DOA: 06/22/2024 DOS: the patient was seen and examined on 06/22/2024 PCP: Cleotilde Oneil FALCON, MD  Patient coming from: {Point_of_Origin:26777}  Chief Complaint:  Chief Complaint  Patient presents with   Tachycardia   HPI: Kerry Graham is a 83 y.o. male with medical history significant of ***  Review of Systems: {ROS_Text:26778} Past Medical History:  Diagnosis Date   Atrial fibrillation (HCC)    Chronic anticoagulation    Coronary artery disease    Hx of CABG 2001   Hyperlipidemia    Hypertension    Vitamin B 12 deficiency    Past Surgical History:  Procedure Laterality Date   ATRIAL FIBRILLATION ABLATION     CARDIAC CATHETERIZATION     CARDIAC SURGERY     COLONOSCOPY WITH PROPOFOL  N/A 11/18/2019   Procedure: COLONOSCOPY WITH PROPOFOL ;  Surgeon: Toledo, Ladell POUR, MD;  Location: [facility] ENDOSCOPY;  Service: Gastroenterology;  Laterality: N/A;  PATIENT ON ELIQUIS    CORONARY ARTERY BYPASS GRAFT     HERNIA REPAIR     KNEE ARTHROSCOPY WITH MEDIAL MENISECTOMY Right 12/15/2020   Procedure: Right knee arthroscopy, partial medial meniscectomy;  Surgeon: Kathlynn Sharper, MD;  Location: [facility] ORS;  Service: Orthopedics;  Laterality: Right;   Social History:  reports that he has never smoked. He has never used smokeless tobacco. He reports that he does not drink alcohol and does not use drugs.  No Known Allergies  No family history on file.  Prior to Admission medications   Medication Sig Start Date End Date Taking? Authorizing Provider  amLODipine  (NORVASC ) 5 MG tablet Take 5 mg by mouth at bedtime. 09/03/17   [provider]  atorvastatin  (LIPITOR) 80 MG tablet Take 80 mg by mouth at bedtime. 06/11/17   [provider]  cetirizine (ZYRTEC) 10 MG tablet Take 10 mg by mouth at bedtime.    [provider]  ELIQUIS  5 MG TABS tablet Take 5 mg by mouth 2 (two) times daily. 06/12/17    [provider]  ibuprofen (ADVIL) 200 MG tablet Take 200 mg by mouth at bedtime. Patient not taking: Reported on 04/12/2024    [provider]  meclizine (ANTIVERT) 25 MG tablet Take 25 mg by mouth 3 (three) times daily as needed for dizziness.    [provider]  metoprolol  succinate (TOPROL -XL) 50 MG 24 hr tablet Take 50 mg by mouth at bedtime. 07/11/17   [provider]  Multiple Vitamins-Minerals (MULTIVITAMIN WITH MINERALS) tablet Take 1 tablet by mouth daily.    [provider]  ramipril  (ALTACE ) 10 MG capsule Take 10 mg by mouth at bedtime. 06/11/17   [provider]  vitamin B-12 (CYANOCOBALAMIN ) 1000 MCG tablet Take 1,000 mcg by mouth daily.    [provider]  vitamin C (ASCORBIC ACID) 500 MG tablet Take 500 mg by mouth daily.    [provider]  Vitamin D3 (VITAMIN D) 25 MCG tablet Take 1,000 Units by mouth daily.    [provider]    Physical Exam: Vitals:   06/22/24 1744 06/22/24 1745 06/22/24 1830 06/22/24 2206  BP:  125/63 (!) 109/55 127/64  Pulse:  77 73 72  Resp:  12 13 18   Temp:  97.8 F (36.6 C)  98.9 F (37.2 C)  TempSrc:  Oral  Oral  SpO2:  100% 100% 100%  Weight: 76.2 kg     Height: 5' 7 (1.702 m)      ***  Data Reviewed: {Tip this will not be part of the note when signed- Document your independent interpretation of telemetry tracing, EKG, lab, Radiology test or any other diagnostic tests. Add any new diagnostic test ordered today. (Optional):26781} {Results:26384}  Assessment and Plan: No notes have been filed under this hospital service. Service: Hospitalist     Advance Care Planning:   Code Status: Full Code ***  Consults: ***  Family Communication: ***  Severity of Illness: {Observation/Inpatient:21159}  Author: Maude MARLA Dart, MD 06/22/2024 10:45 PM  For on call review [email or web address].

## 2024-06-22 NOTE — ED Triage Notes (Signed)
 BIBEMS from home. Complaining of chest pain and tachycardia with a rate of 200s. EMS gave 6mg  of Adenosine  and then 12mg  of Adenosine . Pt converted to NSR with a rate of 70s. Pt is A&Ox4 and resolution of chest pain.

## 2024-06-22 NOTE — ED Notes (Signed)
 Pt disconnected from monitoring equipment and assisted to toilet in room. Pt able to stand with minimal assist. Denies dizziness. Pt urinated and returned to bed.

## 2024-06-22 NOTE — H&P (Signed)
 History and Physical    Patient: Kerry Graham FMW:[id number] DOB: 1941/04/01 DOA: 06/22/2024 DOS: the patient was seen and examined on 06/22/2024 PCP: Cleotilde Oneil FALCON, MD  Patient coming from: Home  Chief Complaint:  Chief Complaint  Patient presents with   Tachycardia   HPI: Kerry Graham is a 83 y.o. male with medical history significant of coronary artery disease status post CABG, paroxysmal atrial fibrillation status post ablation in March 2020, 11/2022 and July 2025.  He had previously failed dofetilide and amiodarone .  Since his last cardiac ablation, patient has been on metoprolol  for rate control.  He had been doing fairly well until this afternoon.  Patient reports having worked in his yard, had gone back into his home and drank a cold cup of water.  He suddenly felt palpitations and sudden onset of chest pain.  Chest pain was central and nonradiating.  No associated nausea, vomiting or lightheadedness.  He called EMS and was found to be in SVT.  He was given a dose of adenosine  in the field with resolution to sinus rhythm.  Patient has since remained chest pain-free.  He was brought to the emergency room to be further evaluated.  Review of Systems: As mentioned in the history of present illness. All other systems reviewed and are negative. Past Medical History:  Diagnosis Date   Atrial fibrillation (HCC)    Chronic anticoagulation    Coronary artery disease    Hx of CABG 2001   Hyperlipidemia    Hypertension    Vitamin B 12 deficiency    Past Surgical History:  Procedure Laterality Date   ATRIAL FIBRILLATION ABLATION     CARDIAC CATHETERIZATION     CARDIAC SURGERY     COLONOSCOPY WITH PROPOFOL  N/A 11/18/2019   Procedure: COLONOSCOPY WITH PROPOFOL ;  Surgeon: Toledo, Ladell POUR, MD;  Location: [facility] ENDOSCOPY;  Service: Gastroenterology;  Laterality: N/A;  PATIENT ON ELIQUIS    CORONARY ARTERY BYPASS GRAFT     HERNIA REPAIR     KNEE ARTHROSCOPY WITH MEDIAL MENISECTOMY  Right 12/15/2020   Procedure: Right knee arthroscopy, partial medial meniscectomy;  Surgeon: Kathlynn Sharper, MD;  Location: [facility] ORS;  Service: Orthopedics;  Laterality: Right;   Social History:  reports that he has never smoked. He has never used smokeless tobacco. He reports that he does not drink alcohol and does not use drugs.  No Known Allergies  No family history on file.  Prior to Admission medications   Medication Sig Start Date End Date Taking? Authorizing Provider  amLODipine  (NORVASC ) 5 MG tablet Take 5 mg by mouth at bedtime. 09/03/17   [provider]  atorvastatin  (LIPITOR) 80 MG tablet Take 80 mg by mouth at bedtime. 06/11/17   [provider]  cetirizine (ZYRTEC) 10 MG tablet Take 10 mg by mouth at bedtime.    [provider]  ELIQUIS  5 MG TABS tablet Take 5 mg by mouth 2 (two) times daily. 06/12/17   [provider]  ibuprofen (ADVIL) 200 MG tablet Take 200 mg by mouth at bedtime. Patient not taking: Reported on 04/12/2024    [provider]  meclizine (ANTIVERT) 25 MG tablet Take 25 mg by mouth 3 (three) times daily as needed for dizziness.    [provider]  metoprolol  succinate (TOPROL -XL) 50 MG 24 hr tablet Take 50 mg by mouth at bedtime. 07/11/17   [provider]  Multiple Vitamins-Minerals (MULTIVITAMIN WITH MINERALS) tablet Take 1 tablet by mouth daily.  [provider]  ramipril  (ALTACE ) 10 MG capsule Take 10 mg by mouth at bedtime. 06/11/17   [provider]  vitamin B-12 (CYANOCOBALAMIN ) 1000 MCG tablet Take 1,000 mcg by mouth daily.    [provider]  vitamin C (ASCORBIC ACID) 500 MG tablet Take 500 mg by mouth daily.    [provider]  Vitamin D3 (VITAMIN D) 25 MCG tablet Take 1,000 Units by mouth daily.    [provider]    Physical Exam: Vitals:   06/22/24 1744 06/22/24 1745 06/22/24 1830 06/22/24 2206  BP:  125/63 (!) 109/55 127/64  Pulse:  77 73 72   Resp:  12 13 18   Temp:  97.8 F (36.6 C)  98.9 F (37.2 C)  TempSrc:  Oral  Oral  SpO2:  100% 100% 100%  Weight: 76.2 kg     Height: 5' 7 (1.702 m)      General: Patient is well appearing gentleman who was seen laying comfortably in bed.  Not in any acute distress HEENT: Oral mucosa moist. Neck: Supple no JVD Chest: Clinically clear with no added sounds diminished at the bases of the lungs Cardiovascular: Regular S1-S2 no murmur Abdomen: Soft nontender with no organomegaly Extremities without any pedal edema Skin negative for any new rash.  Data Reviewed: Sodium was 134, potassium 4.6, chloride is 99, bicarb 24, glucose 146, BUN 20, creatinine 1.18, calcium  8.5, anion gap 11, AST 38, ALT 18, total bilirubin 1.3, troponin 223 initially on presentation.  Currently 805.  WBCs 9.4, hemoglobin 12.5, hematocrit 38, platelet count 272  Chest x-ray: Normal lung parenchyma.  No acute bony abnormalities.  EKG: Has been ordered and pending   Assessment and Plan: 83 year old with known history of coronary artery disease, recurrent paroxysmal atrial fibrillation with RVR status post cardiac ablation in July 2025.  Presents on account of chest pain and palpitations.  Found to be in A-fib with RVR.  Incidental finding of troponin leak concerning for NSTEMI.   #1.  Acute chest pain/NSTEMI: Significant elevated cardiac markers in the context of SVT.  Patient has known underlying history of coronary disease status post CABG in 2001.  Due to marked rise in cardiac markers, patient will be initiated on heparin  protocol per ACS.  Continue on high-dose statin and aspirin .  Cardiology has been consulted to evaluate and advise on further restratification.  A transthoracic echocardiogram will be ordered to evaluate left wall motion abnormalities  #2.  SVT: Patient with known history of paroxysmal atrial fibrillation with recurrent arrhythmias.  Status post multiple ablations and cardioversions.  Last  ablation on 03/31/2024 at [facility].  Patient is back in sinus rhythm after receiving adenosine .  Clinically asymptomatic.  Vital stable.  Patient was resumed on home medications including metoprolol .  Patient reportedly failed dofetilide and amiodarone  in the past.  #3.  Hypertension: Blood pressure stable.  Continue with home medications  #4.  Hyperlipidemia: Continue with high-dose atorvastatin      Advance Care Planning:   Code Status: Full Code   Consults: Cardiology consult  Family Communication:   Severity of Illness: The appropriate patient status for this patient is INPATIENT. Inpatient status is judged to be reasonable and necessary in order to provide the required intensity of service to ensure the patient's safety. The patient's presenting symptoms, physical exam findings, and initial radiographic and laboratory data in the context of their chronic comorbidities is felt to place them at high risk for further clinical deterioration. Furthermore, it is not anticipated  that the patient will be medically stable for discharge from the hospital within 2 midnights of admission.   * I certify that at the point of admission it is my clinical judgment that the patient will require inpatient hospital care spanning beyond 2 midnights from the point of admission due to high intensity of service, high risk for further deterioration and high frequency of surveillance required.*  Author: Maude MARLA Dart, MD 06/22/2024 11:00 PM  For on call review [email or web address].

## 2024-06-22 NOTE — ED Provider Notes (Signed)
  [facility] Provider Note    Event Date/Time   First MD Initiated Contact with Patient 06/22/24 1740     (approximate)  History   Chief Complaint: Tachycardia  HPI  DEO MEHRINGER is a 83 y.o. male with a past medical history of atrial fibrillation, SVT status post ablation in July of this year, CAD, CABG, hypertension, hyperlipidemia, presents to the emergency department for chest pressure/SVT.  According to the patient he was at home when he began feeling a chest pressure sensation and became somewhat sweaty.  Patient states he has had similar symptoms in the past with his SVT.  He called EMS and they confirmed patient was in SVT.  They gave 6 mg of adenosine  followed by 12 mg of adenosine  and the patient converted back to a normal sinus rhythm.  Upon arrival patient is in her normal sinus rhythm in the 70s.  Patient denies any complaints denies any chest pain or shortness of breath.  States he is feeling much better.  Physical Exam   Triage Vital Signs: ED Triage Vitals  Encounter Vitals Group     BP 06/22/24 1745 125/63     Girls Systolic BP Percentile --      Girls Diastolic BP Percentile --      Boys Systolic BP Percentile --      Boys Diastolic BP Percentile --      Pulse Rate 06/22/24 1745 77     Resp 06/22/24 1745 12     Temp 06/22/24 1745 97.8 F (36.6 C)     Temp Source 06/22/24 1745 Oral     SpO2 06/22/24 1745 100 %     Weight 06/22/24 1744 168 lb (76.2 kg)     Height 06/22/24 1744 5' 7 (1.702 m)     Head Circumference --      Peak Flow --      Pain Score 06/22/24 1743 0     Pain Loc --      Pain Education --      Exclude from Growth Chart --     Most recent vital signs: Vitals:   06/22/24 1745  BP: 125/63  Pulse: 77  Resp: 12  Temp: 97.8 F (36.6 C)  SpO2: 100%    General: Awake, no distress.  CV:  Good peripheral perfusion.  Regular rate and rhythm  Resp:  Normal effort.  Equal breath sounds bilaterally.  Abd:  No  distention.  Soft, nontender.  No rebound or guarding.  ED Results / Procedures / Treatments   EKG  EKG viewed and interpreted by myself appears to show a sinus rhythm at 77 bpm with a narrow QRS, left axis deviation, largely normal intervals with nonspecific ST changes.  RADIOLOGY  I have reviewed interpret the chest x-ray images.  No consolidation on my evaluation. Radiology has read the x-ray is negative.  MEDICATIONS ORDERED IN ED: Medications - No data to display   IMPRESSION / MDM / ASSESSMENT AND PLAN / ED COURSE  I reviewed the triage vital signs and the nursing notes.  Patient's presentation is most consistent with acute presentation with potential threat to life or bodily function.  Patient presents to the emergency department for chest pressure and rapid heart rate.  Patient found to be in SVT by EMS received adenosine  6, 12 with conversion back to normal sinus rhythm.  Patient remains in a normal sinus rhythm currently.  Will continue to closely monitor on telemetry.  We will check  labs including cardiac enzymes x 2 as well as a chest x-ray as a precaution.  Vital signs reassuring.  Patient appears well and denies any symptoms at this time.  Patient's workup shows a reassuring CBC, overall reassuring chemistry with normal renal function.  Patient's troponin is 15 chest x-ray is clear and EKG shows no significant finding.  Patient's repeat troponin has resulted significantly elevated at 223.  I spoke to Dr. Wilburn of cardiology.  He states this is higher than he would expect for demand ischemia.  Given the patient's cardiac history of CAD, CABG, paroxysmal atrial flutter, recent ablation, he would like the patient started on heparin  and admitted to the hospitalist for further cardiology workup including echocardiogram and trending of troponins.  Discussed this with the patient and family they are agreeable to this plan.  CRITICAL CARE Performed by: Franky Moores   Total  critical care time: 30 minutes  Critical care time was exclusive of separately billable procedures and treating other patients.  Critical care was necessary to treat or prevent imminent or life-threatening deterioration.  Critical care was time spent personally by me on the following activities: development of treatment plan with patient and/or surrogate as well as nursing, discussions with consultants, evaluation of patient's response to treatment, examination of patient, obtaining history from patient or surrogate, ordering and performing treatments and interventions, ordering and review of laboratory studies, ordering and review of radiographic studies, pulse oximetry and re-evaluation of patient's condition.   FINAL CLINICAL IMPRESSION(S) / ED DIAGNOSES   Supraventricular tachycardia, resolved NSTEMI Elevated troponin  Note:  This document was prepared using Dragon voice recognition software and may include unintentional dictation errors.   Moores Franky, MD 06/22/24 2113

## 2024-06-23 ENCOUNTER — Inpatient Hospital Stay
Admit: 2024-06-23 | Discharge: 2024-06-23 | Disposition: A | Discharge status: Home / Self Care | Attending: Student | Admitting: Student

## 2024-06-23 ENCOUNTER — Encounter: Payer: Self-pay | Admitting: Internal Medicine

## 2024-06-23 ENCOUNTER — Inpatient Hospital Stay

## 2024-06-23 DIAGNOSIS — I471 Supraventricular tachycardia, unspecified: Secondary | ICD-10-CM | POA: Diagnosis not present

## 2024-06-23 DIAGNOSIS — I214 Non-ST elevation (NSTEMI) myocardial infarction: Secondary | ICD-10-CM | POA: Diagnosis not present

## 2024-06-23 LAB — NM MYOCAR MULTI W/SPECT W/WALL MOTION / EF
Base ST Depression (mm): 0 mm
Estimated workload: 1
Exercise duration (min): 1 min
Exercise duration (sec): 0 s
LV dias vol: 94 mL (ref 62–150)
LV sys vol: 40 mL (ref 4.2–5.8)
MPHR: 139 {beats}/min
Nuc Stress EF: 57 %
Peak HR: 85 {beats}/min
Percent HR: 61 %
Rest HR: 68 {beats}/min
Rest Nuclear Isotope Dose: 10.3 mCi
SDS: 2
SRS: 0
SSS: 3
ST Depression (mm): 0 mm
Stress Nuclear Isotope Dose: 33.4 mCi
TID: 1.05

## 2024-06-23 LAB — ECHOCARDIOGRAM COMPLETE
AR max vel: 1.69 cm2
AV Area VTI: 1.58 cm2
AV Area mean vel: 1.4 cm2
AV Mean grad: 4.7 mmHg
AV Peak grad: 8 mmHg
Ao pk vel: 1.41 m/s
Area-P 1/2: 2.82 cm2
Height: 67 in
S' Lateral: 3.5 cm
Weight: 2688 [oz_av]

## 2024-06-23 LAB — BASIC METABOLIC PANEL WITH GFR
Anion gap: 10 (ref 5–15)
BUN: 16 mg/dL (ref 8–23)
CO2: 23 mmol/L (ref 22–32)
Calcium: 8.4 mg/dL — ABNORMAL LOW (ref 8.9–10.3)
Chloride: 103 mmol/L (ref 98–111)
Creatinine, Ser: 0.81 mg/dL (ref 0.61–1.24)
GFR, Estimated: >60 mL/min (ref >60)
Glucose, Bld: 90 mg/dL (ref 70–99)
Potassium: 3.9 mmol/L (ref 3.5–5.1)
Sodium: 136 mmol/L (ref 135–145)

## 2024-06-23 LAB — APTT
aPTT: 63 s — ABNORMAL HIGH (ref 24–36)
aPTT: 36 s (ref 24–36)

## 2024-06-23 LAB — CBC
HCT: 34.9 % — ABNORMAL LOW (ref 39.0–52.0)
Hemoglobin: 11.7 g/dL — ABNORMAL LOW (ref 13.0–17.0)
MCH: 31.8 pg (ref 26.0–34.0)
MCHC: 33.5 g/dL (ref 30.0–36.0)
MCV: 94.8 fL (ref 80.0–100.0)
Platelets: 246 K/uL (ref 150–400)
RBC: 3.68 MIL/uL — ABNORMAL LOW (ref 4.22–5.81)
RDW: 13.8 % (ref 11.5–15.5)
WBC: 8.8 K/uL (ref 4.0–10.5)
nRBC: 0 % (ref 0.0–0.2)

## 2024-06-23 LAB — HEPARIN LEVEL (UNFRACTIONATED): Heparin Unfractionated: 1 [IU]/mL — ABNORMAL HIGH (ref 0.30–0.70)

## 2024-06-23 LAB — TROPONIN I (HIGH SENSITIVITY): Troponin I (High Sensitivity): 630 ng/L (ref <18)

## 2024-06-23 MED ORDER — AMIODARONE HCL 200 MG PO TABS: 400.0000 mg | ORAL_TABLET | Freq: Two times a day (BID) | ORAL | Status: DC

## 2024-06-23 MED ORDER — TECHNETIUM TC 99M TETROFOSMIN IV KIT
30.0000 | PACK | Freq: Once | INTRAVENOUS | Status: AC | PRN
  Administered 2024-06-23: 33.44 via INTRAVENOUS

## 2024-06-23 MED ORDER — HEPARIN BOLUS VIA INFUSION
2300.0000 [IU] | Freq: Once | INTRAVENOUS | Status: AC
  Administered 2024-06-23: 2300 [IU] via INTRAVENOUS
  Filled 2024-06-23: qty 2300

## 2024-06-23 MED ORDER — TECHNETIUM TC 99M TETROFOSMIN IV KIT
10.0000 | PACK | Freq: Once | INTRAVENOUS | Status: AC | PRN
  Administered 2024-06-23: 10.31 via INTRAVENOUS

## 2024-06-23 MED ORDER — HEPARIN BOLUS VIA INFUSION
1150.0000 [IU] | Freq: Once | INTRAVENOUS | Status: AC
  Administered 2024-06-23: 1150 [IU] via INTRAVENOUS
  Filled 2024-06-23: qty 1150

## 2024-06-23 MED ORDER — AMIODARONE HCL 200 MG PO TABS: ORAL_TABLET | ORAL | 0 refills | Status: AC

## 2024-06-23 MED ORDER — REGADENOSON 0.4 MG/5ML IV SOLN
0.4000 mg | Freq: Once | INTRAVENOUS | Status: AC
  Administered 2024-06-23: 0.4 mg via INTRAVENOUS

## 2024-06-23 MED ORDER — AMIODARONE HCL 200 MG PO TABS: 200.0000 mg | ORAL_TABLET | Freq: Every day | ORAL | Status: DC

## 2024-06-23 NOTE — Progress Notes (Signed)
 PHARMACY - ANTICOAGULATION CONSULT NOTE  Pharmacy Consult for heparin  Indication: chest pain/ACS  No Known Allergies  Patient Measurements: Height: 5' 7 (170.2 cm) Weight: 76.2 kg (168 lb) IBW/kg (Calculated) : 66.1 HEPARIN  DW (KG): 76.2  Vital Signs: Temp: 98 F (36.7 C) (09/23 1344) Temp Source: Oral (09/23 1344) BP: 149/100 (09/23 1130) Pulse Rate: 72 (09/23 1130)  Labs: Recent Labs    06/22/24 1741 06/22/24 1948 06/22/24 2201 06/22/24 2219 06/23/24 0414 06/23/24 0816 06/23/24 1356  HGB 12.5*  --   --   --  11.7*  --   --   HCT 37.9*  --   --   --  34.9*  --   --   PLT 272  --   --   --  246  --   --   APTT  --   --  37*  --  63*  --  36  LABPROT  --   --  16.6*  --   --   --   --   INR  --   --  1.3*  --   --   --   --   HEPARINUNFRC  --   --  >1.10*  --  1.00*  --   --   CREATININE 1.18  --   --   --  0.81  --   --   TROPONINIHS 15 223*  --  805*  --  630*  --     Estimated Creatinine Clearance: 66.9 mL/min (by C-G formula based on SCr of 0.81 mg/dL).   Medical History: Past Medical History:  Diagnosis Date   Atrial fibrillation (HCC)    Chronic anticoagulation    Coronary artery disease    Hx of CABG 2001   Hyperlipidemia    Hypertension    Vitamin B 12 deficiency    Assessment: 83 y/o male presenting with chest pain, elevated troponins. PMH significant for atrial fibrillation on Eliquis , SVT status post ablation in July of this year, CAD, CABG, hypertension, hyperlipidemia. Pharmacy has been consulted to initiate and manage heparin  infusion.  Last dose Eliquis : 06/22/24 @ 0830 Baseline labs: heparin  level/aPTT/INR ordered, hgb 12.5, plt 272  9/23 @ 0414:  aPTT = 63, HL = 1.00 9/23 @ 1356: aPTT = 36  Goal of Therapy:  Heparin  level 0.3-0.7 units/ml aPTT 66-102 seconds Monitor platelets by anticoagulation protocol: Yes   Plan: 9/23 @ 1356: aPTT = 36 - aPTT SUBtherapeutic,  HL this morning elevated from Eliquis  PTA - will order heparin  2300  units IV X 1 bolus and increase drip rate to 1250 units/hr - will recheck aPTT 8 hrs after rate change - recheck HL on 9/24 with AM labs - Will monitor using aPTT levels until correlating with heparin  levels, then switch to using heparin  levels only  Thank you for involving pharmacy in this patient's care.   Lum VEAR Mania, PharmD, BCPS Clinical Pharmacist 06/23/2024 2:32 PM

## 2024-06-23 NOTE — Progress Notes (Signed)
 PHARMACY - ANTICOAGULATION CONSULT NOTE  Pharmacy Consult for heparin  Indication: chest pain/ACS  No Known Allergies  Patient Measurements: Height: 5' 7 (170.2 cm) Weight: 76.2 kg (168 lb) IBW/kg (Calculated) : 66.1 HEPARIN  DW (KG): 76.2  Vital Signs: Temp: 98.1 F (36.7 C) (09/23 0449) Temp Source: Oral (09/23 0449) BP: 121/68 (09/23 0430) Pulse Rate: 63 (09/23 0430)  Labs: Recent Labs    06/22/24 1741 06/22/24 1948 06/22/24 2201 06/22/24 2219 06/23/24 0414  HGB 12.5*  --   --   --  11.7*  HCT 37.9*  --   --   --  34.9*  PLT 272  --   --   --  246  APTT  --   --  37*  --  63*  LABPROT  --   --  16.6*  --   --   INR  --   --  1.3*  --   --   HEPARINUNFRC  --   --  >1.10*  --  1.00*  CREATININE 1.18  --   --   --   --   TROPONINIHS 15 223*  --  805*  --     Estimated Creatinine Clearance: 45.9 mL/min (by C-G formula based on SCr of 1.18 mg/dL).   Medical History: Past Medical History:  Diagnosis Date   Atrial fibrillation (HCC)    Chronic anticoagulation    Coronary artery disease    Hx of CABG 2001   Hyperlipidemia    Hypertension    Vitamin B 12 deficiency    Assessment: 83 y/o male presenting with chest pain, elevated troponins. PMH significant for atrial fibrillation on Eliquis , SVT status post ablation in July of this year, CAD, CABG, hypertension, hyperlipidemia. Pharmacy has been consulted to initiate and manage heparin  infusion.  Last dose Eliquis : 06/22/24 @ 0830 Baseline labs: heparin  level/aPTT/INR ordered, hgb 12.5, plt 272  Goal of Therapy:  Heparin  level 0.3-0.7 units/ml aPTT 66-102 seconds Monitor platelets by anticoagulation protocol: Yes   Plan: 9/23 @ 0414:  aPTT = 63, HL = 1.00 - aPTT SUBtherapeutic,  HL elevated from Eliquis  PTA - will order heparin  1150 units IV X 1 bolus and increase drip rate to 1050 units/hr - will recheck aPTT 8 hrs after rate change - recheck HL on 9/24 with AM labs - Will monitor using aPTT levels until  correlating with heparin  levels, then switch to using heparin  levels only  Thank you for involving pharmacy in this patient's care.   Ky Rumple D Clinical Pharmacist 06/23/2024 5:38 AM

## 2024-06-23 NOTE — ED Notes (Signed)
 This tech assisted this pt to the bathroom

## 2024-06-23 NOTE — Progress Notes (Signed)
 PROGRESS NOTE   HPI was taken from Dr. Marca: Kerry Graham is a 83 y.o. male with medical history significant of coronary artery disease status post CABG, paroxysmal atrial fibrillation status post ablation in March 2020, 11/2022 and July 2025.  He had previously failed dofetilide and amiodarone .  Since his last cardiac ablation, patient has been on metoprolol  for rate control.  He had been doing fairly well until this afternoon.  Patient reports having worked in his yard, had gone back into his home and drank a cold cup of water.  He suddenly felt palpitations and sudden onset of chest pain.  Chest pain was central and nonradiating.  No associated nausea, vomiting or lightheadedness.  He called EMS and was found to be in SVT.  He was given a dose of adenosine  in the field with resolution to sinus rhythm.  Patient has since remained chest pain-free.  He was brought to the emergency room to be further evaluated.      Kerry Graham  FMW:[id number] DOB: Aug 13, 1941 DOA: 06/22/2024 PCP: Cleotilde Oneil FALCON, MD   Assessment & Plan:   Principal Problem:   SVT (supraventricular tachycardia)  Assessment and Plan:  NSTEMI: w/ elevated troponins in setting of SVT. Will go for cardiac stress test and echo as per cardio. Continue on IV heparin  drip. Continue on tele. Nitro, morphine prn. Cardio following and recs apprec    SVT:  w/ hx of PAF & recurrent arrhythmias.  S/p multiple ablations and cardioversions.  Last ablation on 03/31/2024 at [facility]. S/p adenosine . Continue on tele. Continue on metoprolol  and previously failed amio & dofetilide.      HTN: continue on metoprolol , ramipril     HLD: continue on statin       DVT prophylaxis: IV heparin  drip  Code Status: full  Family Communication: discussed pt's care w/ pt's family at bedside and answered their questions  Disposition Plan: likely d/c back home  Level of care: Progressive  Status is: Inpatient Remains inpatient appropriate  because: severity of illness, stress test today    Consultants:  Cardio   Procedures:   Antimicrobials:   Subjective: Pt c/o malaise  Objective: Vitals:   06/23/24 0700 06/23/24 0730 06/23/24 0744 06/23/24 0836  BP: 132/74 132/71    Pulse: 66 73    Resp: 15 20    Temp:    98 F (36.7 C)  TempSrc:    Oral  SpO2: 97% 99% 100%   Weight:      Height:       No intake or output data in the 24 hours ending 06/23/24 0947 Filed Weights   06/22/24 1744  Weight: 76.2 kg    Examination:  General exam: Appears calm and comfortable  Respiratory system: Clear to auscultation. Respiratory effort normal. Cardiovascular system: S1 & S2 +. No rubs, gallops or clicks.  Gastrointestinal system: Abdomen is nondistended, soft and nontender.Normal bowel sounds heard. Central nervous system: Alert and oriented. Moves all extremities  Psychiatry: Judgement and insight appear normal. Mood & affect appropriate.     Data Reviewed: I have personally reviewed following labs and imaging studies  CBC: Recent Labs  Lab 06/22/24 1741 06/23/24 0414  WBC 9.4 8.8  NEUTROABS 6.2  --   HGB 12.5* 11.7*  HCT 37.9* 34.9*  MCV 96.9 94.8  PLT 272 246   Basic Metabolic Panel: Recent Labs  Lab 06/22/24 1741 06/23/24 0414  NA 134* 136  K 4.6 3.9  CL 99 103  CO2 24  23  GLUCOSE 146* 90  BUN 20 16  CREATININE 1.18 0.81  CALCIUM  8.5* 8.4*   GFR: Estimated Creatinine Clearance: 66.9 mL/min (by C-G formula based on SCr of 0.81 mg/dL). Liver Function Tests: Recent Labs  Lab 06/22/24 1741  AST 38  ALT 18  ALKPHOS 58  BILITOT 1.3*  PROT 7.1  ALBUMIN 3.6   No results for input(s): LIPASE, AMYLASE in the last 168 hours. No results for input(s): AMMONIA in the last 168 hours. Coagulation Profile: Recent Labs  Lab 06/22/24 2201  INR 1.3*   Cardiac Enzymes: No results for input(s): CKTOTAL, CKMB, CKMBINDEX, TROPONINI in the last 168 hours. BNP (last 3 results) No  results for input(s): PROBNP in the last 8760 hours. HbA1C: No results for input(s): HGBA1C in the last 72 hours. CBG: No results for input(s): GLUCAP in the last 168 hours. Lipid Profile: No results for input(s): CHOL, HDL, LDLCALC, TRIG, CHOLHDL, LDLDIRECT in the last 72 hours. Thyroid Function Tests: No results for input(s): TSH, T4TOTAL, FREET4, T3FREE, THYROIDAB in the last 72 hours. Anemia Panel: No results for input(s): VITAMINB12, FOLATE, FERRITIN, TIBC, IRON, RETICCTPCT in the last 72 hours. Sepsis Labs: No results for input(s): PROCALCITON, LATICACIDVEN in the last 168 hours.  No results found for this or any previous visit (from the past 240 hours).       Radiology Studies: DG Chest Portable 1 View Result Date: 06/22/2024 CLINICAL DATA:  Chest pain EXAM: PORTABLE CHEST 1 VIEW COMPARISON:  04/12/2024 FINDINGS: Prior CABG. Heart and mediastinal contours are within normal limits. No focal opacities or effusions. No acute bony abnormality. IMPRESSION: No active disease. Electronically Signed   By: Franky Crease M.D.   On: 06/22/2024 18:13        Scheduled Meds:  aspirin  EC  81 mg Oral Daily   atorvastatin   80 mg Oral Daily   metoprolol  succinate  50 mg Oral BID   ramipril   10 mg Oral Daily   Continuous Infusions:  heparin  1,050 Units/hr (06/23/24 0610)     LOS: 1 day     Anthony CHRISTELLA Pouch, MD [facility] Pager [phone]  If 7PM-7AM, please contact night-coverage [email or web address] 06/23/2024, 9:47 AM

## 2024-06-23 NOTE — Care Management Obs Status (Signed)
 MEDICARE OBSERVATION STATUS NOTIFICATION   Patient Details  Name: LARELL BANEY MRN: [id number] Date of Birth: 07/16/1941   Medicare Observation Status Notification Given:  Yes    Edsel DELENA Fischer, LCSW 06/23/2024, 5:27 PM

## 2024-06-23 NOTE — Progress Notes (Signed)
*  PRELIMINARY RESULTS* Echocardiogram 2D Echocardiogram has been performed.  Kerry Graham 06/23/2024, 9:50 AM

## 2024-06-23 NOTE — Discharge Summary (Signed)
 Physician Discharge Summary  Kerry Graham FMW:[id number] DOB: November 21, 1940 DOA: 06/22/2024  PCP: Cleotilde Oneil FALCON, MD  Admit date: 06/22/2024 Discharge date: 06/23/2024  Admitted From: home  Disposition:  home  Recommendations for Outpatient Follow-up:  Follow up with PCP in 1-2 weeks F/u w/ EP ASAP  Home Health: no  Equipment/Devices:  Discharge Condition: stable  CODE STATUS: full  Diet recommendation: Heart Healthy  Brief/Interim Summary: HPI was taken from Dr. Marca:  Kerry Graham is a 83 y.o. male with medical history significant of coronary artery disease status post CABG, paroxysmal atrial fibrillation status post ablation in March 2020, 11/2022 and July 2025.  He had previously failed dofetilide and amiodarone .  Since his last cardiac ablation, patient has been on metoprolol  for rate control.  He had been doing fairly well until this afternoon.  Patient reports having worked in his yard, had gone back into his home and drank a cold cup of water.  He suddenly felt palpitations and sudden onset of chest pain.  Chest pain was central and nonradiating.  No associated nausea, vomiting or lightheadedness.  He called EMS and was found to be in SVT.  He was given a dose of adenosine  in the field with resolution to sinus rhythm.  Patient has since remained chest pain-free.  He was brought to the emergency room to be further evaluated.   Discharge Diagnoses:  Principal Problem:   SVT (supraventricular tachycardia)  NSTEMI: w/ elevated troponins in setting of SVT.  Cardiac stress was unremarkable as well as echo as per cardio. D/c IV heparin  drip. Start amio and f/u EP ASAP outpatient as per cardio. Continue on tele. Nitro, morphine prn. Cardio following and recs apprec    SVT:  w/ hx of PAF & recurrent arrhythmias.  S/p multiple ablations and cardioversions.  Last ablation on 03/31/2024 at [facility]. S/p adenosine . Continue on tele. Continue on metoprolol  and previously failed dofetilide  and not amio as per cardio. Started on amio and will f/u outpatient w/ EP    HTN: continue on metoprolol , ramipril     HLD: continue on statin   Discharge Instructions  Discharge Instructions     Diet - low sodium heart healthy   Complete by: As directed    Discharge instructions   Complete by: As directed    F/u w/ EP as soon as possible. F/u w/ PCP in 1-2 weeks   Increase activity slowly   Complete by: As directed       Allergies as of 06/23/2024   No Known Allergies      Medication List     STOP taking these medications    amLODipine  5 MG tablet Commonly known as: NORVASC    ibuprofen 200 MG tablet Commonly known as: ADVIL       TAKE these medications    amiodarone  200 MG tablet Commonly known as: PACERONE  Take 2 tablets (400 mg total) by mouth 2 (two) times daily for 11 days, THEN 1 tablet (200 mg total) daily for 21 days. Start taking on: June 23, 2024   ascorbic acid 500 MG tablet Commonly known as: VITAMIN C Take 500 mg by mouth daily.   atorvastatin  80 MG tablet Commonly known as: LIPITOR Take 80 mg by mouth at bedtime.   cetirizine 10 MG tablet Commonly known as: ZYRTEC Take 10 mg by mouth at bedtime.   cyanocobalamin  1000 MCG tablet Commonly known as: VITAMIN B12 Take 1,000 mcg by mouth daily.   Eliquis  5 MG Tabs tablet  Generic drug: apixaban  Take 5 mg by mouth 2 (two) times daily.   meclizine 25 MG tablet Commonly known as: ANTIVERT Take 25 mg by mouth 3 (three) times daily as needed for dizziness.   Mega Multiple/Chelated Mineral Tabs Take 1 tablet by mouth daily.   metoprolol  succinate 50 MG 24 hr tablet Commonly known as: TOPROL -XL Take 50 mg by mouth at bedtime.   multivitamin with minerals tablet Take 1 tablet by mouth daily.   ramipril  10 MG capsule Commonly known as: ALTACE  Take 10 mg by mouth at bedtime.   vitamin D3 25 MCG tablet Commonly known as: CHOLECALCIFEROL Take 1,000 Units by mouth daily.         Follow-up Information     Marsa Norleen Lukes, MD. Go in 1 week(s).   Contact information: [address] [place] [address] [phone]         Polo Ashley SAILOR, FNP. Go in 1 week(s).   Specialty: Cardiology Why: Call and move up follow up to discuss recent hospitalization Contact information: [address] [place] [address] [phone]                No Known Allergies  Consultations: Cardio    Procedures/Studies: NM Myocar Multi W/Spect W/Wall Motion / EF Result Date: 06/23/2024   The study is normal. The study is low risk.   No ST deviation was noted.   LV perfusion is normal. There is no evidence of ischemia. There is no evidence of infarction.   Left ventricular function is normal. End diastolic cavity size is normal. End systolic cavity size is normal.   ECHOCARDIOGRAM COMPLETE Result Date: 06/23/2024    ECHOCARDIOGRAM REPORT   Patient Name:   Kerry Graham Date of Exam: 06/23/2024 Medical Rec #:  [id number]        Height:       67.0 in Accession #:    [id number]       Weight:       168.0 lb Date of Birth:  1941/04/05        BSA:          1.878 m Patient Age:    82 years         BP:           132/71 mmHg Patient Gender: M                HR:           73 bpm. Exam Location:  [facility] Procedure: 2D Echo, Cardiac Doppler and Color Doppler (Both Spectral and Color            Flow Doppler were utilized during procedure). Indications:     NSTEMI I21.4  History:         Patient has no prior history of Echocardiogram examinations.                  CAD, Arrythmias:Atrial Fibrillation; Risk Factors:Dyslipidemia                  and Hypertension.  Sonographer:     Christopher Furnace Referring Phys:  [id number] CARALYN HUDSON Diagnosing Phys: Keller Paterson  Sonographer Comments: Apical views are off axis. IMPRESSIONS  1. Left ventricular ejection fraction, by estimation, is 55 to 60%. The left ventricle has normal function. The left ventricle  has no regional wall motion abnormalities. There is mild left ventricular hypertrophy. Left ventricular diastolic parameters are consistent with  Grade I diastolic dysfunction (impaired relaxation).  2. Right ventricular systolic function is normal. The right ventricular size is normal.  3. Left atrial size was moderately dilated.  4. The mitral valve is normal in structure. Trivial mitral valve regurgitation.  5. The aortic valve is tricuspid. Aortic valve regurgitation is not visualized. Aortic valve sclerosis/calcification is present, without any evidence of aortic stenosis.  6. The inferior vena cava is normal in size with greater than 50% respiratory variability, suggesting right atrial pressure of 3 mmHg. FINDINGS  Left Ventricle: Left ventricular ejection fraction, by estimation, is 55 to 60%. The left ventricle has normal function. The left ventricle has no regional wall motion abnormalities. The left ventricular internal cavity size was normal in size. There is  mild left ventricular hypertrophy. Left ventricular diastolic parameters are consistent with Grade I diastolic dysfunction (impaired relaxation). Right Ventricle: The right ventricular size is normal. No increase in right ventricular wall thickness. Right ventricular systolic function is normal. Left Atrium: Left atrial size was moderately dilated. Right Atrium: Right atrial size was normal in size. Pericardium: There is no evidence of pericardial effusion. Mitral Valve: The mitral valve is normal in structure. Trivial mitral valve regurgitation. Tricuspid Valve: The tricuspid valve is normal in structure. Tricuspid valve regurgitation is trivial. Aortic Valve: The aortic valve is tricuspid. Aortic valve regurgitation is not visualized. Aortic valve sclerosis/calcification is present, without any evidence of aortic stenosis. Aortic valve mean gradient measures 4.7 mmHg. Aortic valve peak gradient measures 8.0 mmHg. Aortic valve area, by VTI measures  1.58 cm. Pulmonic Valve: The pulmonic valve was not well visualized. Pulmonic valve regurgitation is trivial. Aorta: The aortic root is normal in size and structure. Venous: The inferior vena cava is normal in size with greater than 50% respiratory variability, suggesting right atrial pressure of 3 mmHg. IAS/Shunts: The atrial septum is grossly normal.  LEFT VENTRICLE PLAX 2D LVIDd:         4.80 cm   Diastology LVIDs:         3.50 cm   LV e' medial:    7.72 cm/s LV PW:         1.50 cm   LV E/e' medial:  13.1 LV IVS:        1.50 cm   LV e' lateral:   12.60 cm/s LVOT diam:     2.00 cm   LV E/e' lateral: 8.0 LV SV:         45 LV SV Index:   24 LVOT Area:     3.14 cm  RIGHT VENTRICLE RV Basal diam:  2.40 cm RV Mid diam:    1.80 cm RV S prime:     9.79 cm/s TAPSE (M-mode): 1.4 cm LEFT ATRIUM           Index        RIGHT ATRIUM           Index LA diam:      3.90 cm 2.08 cm/m   RA Area:     16.20 cm LA Vol (A2C): 44.0 ml 23.43 ml/m  RA Volume:   41.50 ml  22.10 ml/m LA Vol (A4C): 90.2 ml 48.03 ml/m  AORTIC VALVE AV Area (Vmax):    1.69 cm AV Area (Vmean):   1.40 cm AV Area (VTI):     1.58 cm AV Vmax:           141.00 cm/s AV Vmean:          101.500 cm/s  AV VTI:            0.286 m AV Peak Grad:      8.0 mmHg AV Mean Grad:      4.7 mmHg LVOT Vmax:         75.80 cm/s LVOT Vmean:        45.300 cm/s LVOT VTI:          0.144 m LVOT/AV VTI ratio: 0.50  AORTA Ao Root diam: 3.50 cm MITRAL VALVE                TRICUSPID VALVE MV Area (PHT): 2.82 cm     TR Peak grad:   9.7 mmHg MV Decel Time: 269 msec     TR Vmax:        156.00 cm/s MV E velocity: 101.00 cm/s MV A velocity: 30.40 cm/s   SHUNTS MV E/A ratio:  3.32         Systemic VTI:  0.14 m                             Systemic Diam: 2.00 cm Keller Paterson Electronically signed by Keller Paterson Signature Date/Time: 06/23/2024/11:15:33 AM    Final    DG Chest Portable 1 View Result Date: 06/22/2024 CLINICAL DATA:  Chest pain EXAM: PORTABLE CHEST 1 VIEW COMPARISON:   04/12/2024 FINDINGS: Prior CABG. Heart and mediastinal contours are within normal limits. No focal opacities or effusions. No acute bony abnormality. IMPRESSION: No active disease. Electronically Signed   By: Franky Crease M.D.   On: 06/22/2024 18:13   (Echo, Carotid, EGD, Colonoscopy, ERCP)    Subjective: Pt c/o fatigue    Discharge Exam: Vitals:   06/23/24 1344 06/23/24 1509  BP:  (!) 157/86  Pulse:  72  Resp:  17  Temp: 98 F (36.7 C) 97.9 F (36.6 C)  SpO2:  100%   Vitals:   06/23/24 1130 06/23/24 1230 06/23/24 1344 06/23/24 1509  BP: (!) 149/100 120/63  (!) 157/86  Pulse: 72 67  72  Resp: 19 14  17   Temp:   98 F (36.7 C) 97.9 F (36.6 C)  TempSrc:   Oral   SpO2: 100% 100%  100%  Weight:      Height:        General: Pt is alert, awake, not in acute distress Cardiovascular: S1/S2 +, no rubs, no gallops Respiratory: CTA bilaterally, no wheezing, no rhonchi Abdominal: Soft, NT, ND, bowel sounds + Extremities: no edema, no cyanosis    The results of significant diagnostics from this hospitalization (including imaging, microbiology, ancillary and laboratory) are listed below for reference.     Microbiology: No results found for this or any previous visit (from the past 240 hours).   Labs: BNP (last 3 results) Recent Labs    04/12/24 0840  BNP 458.0*   Basic Metabolic Panel: Recent Labs  Lab 06/22/24 1741 06/23/24 0414  NA 134* 136  K 4.6 3.9  CL 99 103  CO2 24 23  GLUCOSE 146* 90  BUN 20 16  CREATININE 1.18 0.81  CALCIUM  8.5* 8.4*   Liver Function Tests: Recent Labs  Lab 06/22/24 1741  AST 38  ALT 18  ALKPHOS 58  BILITOT 1.3*  PROT 7.1  ALBUMIN 3.6   No results for input(s): LIPASE, AMYLASE in the last 168 hours. No results for input(s): AMMONIA in the last 168 hours. CBC: Recent Labs  Lab 06/22/24 1741  06/23/24 0414  WBC 9.4 8.8  NEUTROABS 6.2  --   HGB 12.5* 11.7*  HCT 37.9* 34.9*  MCV 96.9 94.8  PLT 272 246   Cardiac  Enzymes: No results for input(s): CKTOTAL, CKMB, CKMBINDEX, TROPONINI in the last 168 hours. BNP: Invalid input(s): POCBNP CBG: No results for input(s): GLUCAP in the last 168 hours. D-Dimer No results for input(s): DDIMER in the last 72 hours. Hgb A1c No results for input(s): HGBA1C in the last 72 hours. Lipid Profile No results for input(s): CHOL, HDL, LDLCALC, TRIG, CHOLHDL, LDLDIRECT in the last 72 hours. Thyroid function studies No results for input(s): TSH, T4TOTAL, T3FREE, THYROIDAB in the last 72 hours.  Invalid input(s): FREET3 Anemia work up No results for input(s): VITAMINB12, FOLATE, FERRITIN, TIBC, IRON, RETICCTPCT in the last 72 hours. Urinalysis    Component Value Date/Time   COLORURINE YELLOW (A) 12/13/2020 0858   APPEARANCEUR HAZY (A) 12/13/2020 0858   LABSPEC 1.017 12/13/2020 0858   PHURINE 5.0 12/13/2020 0858   GLUCOSEU NEGATIVE 12/13/2020 0858   HGBUR NEGATIVE 12/13/2020 0858   BILIRUBINUR NEGATIVE 12/13/2020 0858   KETONESUR NEGATIVE 12/13/2020 0858   PROTEINUR NEGATIVE 12/13/2020 0858   NITRITE NEGATIVE 12/13/2020 0858   LEUKOCYTESUR NEGATIVE 12/13/2020 0858   Sepsis Labs Recent Labs  Lab 06/22/24 1741 06/23/24 0414  WBC 9.4 8.8   Microbiology No results found for this or any previous visit (from the past 240 hours).   Time coordinating discharge: 35 minutes  SIGNED:   Anthony CHRISTELLA Pouch, MD  [facility] 06/23/2024, 4:38 PM Pager   If 7PM-7AM, please contact night-coverage [email or web address]

## 2024-06-23 NOTE — Consult Note (Signed)
 [facility]INIC CARDIOLOGY CONSULT NOTE       Patient ID: Kerry Graham MRN: [id number] DOB/AGE: [birth year] 83 y.o.  Admit date: 06/22/2024 Referring Physician Dr. Maude Dart Primary Physician Cleotilde Oneil FALCON, MD  Primary Cardiologist [facility] Reason for Consultation SVT, elevated troponin  HPI: Kerry Graham is a 83 y.o. male  with a past medical history of coronary artery disease s/p CABG 2001 (LIMA to LAD, LRA to Ramus, SVG to OM2, SVG to PDA), paroxysmal atrial fibrillation s/p multiple ablations most recently 03/2024, hypertension, hyperlipidemia who presented to the ED on 06/22/2024 for chest pressure, palpitations. Found to be in SVT by EMS and converted with adenosine . Troponins elevated. Cardiology was consulted for further evaluation.   Patient reports that yesterday he drank some cold water, and had chest pain which started right after this. Tried to check BP but cuff would not work. Daughter states HR was in the 200s, EMS was called and he was brought to the ED. In SVT per EMS and given adenosine  6 mg x1, 12 mg x1 and converted. Workup in the ED notable for creatinine 1.18, potassium 4.6, hemoglobin 12.5, WBC 9.4. Troponins 15 > 223 > 805. No EKG done in the ED. CXR in the ED with no acute abnormalities. Started on IV heparin  in the ED.    At the time of my evaluation, he is resting comfortably in hospital bed. CP associated with diaphoresis yesterday. Symptoms not similar to his CP prior to CABG. Had a similar episode earlier this year, he was transferred to [facility] at that time and underwent ablation for atrial flutter. Since that time he states he has been feeling very well. Denies any exertional anginal symptoms.   Review of systems complete and found to be negative unless listed above    Past Medical History:  Diagnosis Date   Atrial fibrillation (HCC)    Chronic anticoagulation    Coronary artery disease    Hx of CABG 2001   Hyperlipidemia    Hypertension    Vitamin  B 12 deficiency     Past Surgical History:  Procedure Laterality Date   ATRIAL FIBRILLATION ABLATION     CARDIAC CATHETERIZATION     CARDIAC SURGERY     COLONOSCOPY WITH PROPOFOL  N/A 11/18/2019   Procedure: COLONOSCOPY WITH PROPOFOL ;  Surgeon: Toledo, Ladell POUR, MD;  Location: [facility] ENDOSCOPY;  Service: Gastroenterology;  Laterality: N/A;  PATIENT ON ELIQUIS    CORONARY ARTERY BYPASS GRAFT     HERNIA REPAIR     KNEE ARTHROSCOPY WITH MEDIAL MENISECTOMY Right 12/15/2020   Procedure: Right knee arthroscopy, partial medial meniscectomy;  Surgeon: Kathlynn Sharper, MD;  Location: [facility] ORS;  Service: Orthopedics;  Laterality: Right;    (Not in a hospital admission)  Social History   Socioeconomic History   Marital status: Married    Spouse name: Not on file   Number of children: Not on file   Years of education: Not on file   Highest education level: Not on file  Occupational History    Employer: [employer]  Tobacco Use   Smoking status: Never   Smokeless tobacco: Never  Vaping Use   Vaping status: Never Used  Substance and Sexual Activity   Alcohol use: No   Drug use: No   Sexual activity: Not on file  Other Topics Concern   Not on file  Social History Narrative   Not on file   Social Drivers of Health   Financial Resource Strain: Low Risk  (  05/11/2024)   Received from [facility]   Overall Financial Resource Strain (CARDIA)    Difficulty of Paying Living Expenses: Not hard at all  Food Insecurity: No Food Insecurity (05/11/2024)   Received from [facility]   Hunger Vital Sign    Within the past 12 months, you worried that your food would run out before you got the money to buy more.: Never true    Within the past 12 months, the food you bought just didn't last and you didn't have money to get more.: Never true  Transportation Needs: No Transportation Needs (05/11/2024)   Received from [facility] -  Transportation    In the past 12 months, has lack of transportation kept you from medical appointments or from getting medications?: No    Lack of Transportation (Non-Medical): No  Physical Activity: Not on file  Stress: Not on file  Social Connections: Not on file  Intimate Partner Violence: Not on file    No family history on file.   Vitals:   06/23/24 0700 06/23/24 0730 06/23/24 0744 06/23/24 0836  BP: 132/74 132/71    Pulse: 66 73    Resp: 15 20    Temp:    98 F (36.7 C)  TempSrc:    Oral  SpO2: 97% 99% 100%   Weight:      Height:        PHYSICAL EXAM General: Well appearing elderly male, well nourished, in no acute distress. HEENT: Normocephalic and atraumatic. Neck: No JVD.  Lungs: Normal respiratory effort on room air. Clear bilaterally to auscultation. No wheezes, crackles, rhonchi.  Heart: HRRR. Normal S1 and S2 without gallops or murmurs.  Abdomen: Non-distended appearing.  Msk: Normal strength and tone for age. Extremities: Warm and well perfused. No clubbing, cyanosis. No edema.  Neuro: Alert and oriented X 3. Psych: Answers questions appropriately.   Labs: Basic Metabolic Panel: Recent Labs    06/22/24 1741 06/23/24 0414  NA 134* 136  K 4.6 3.9  CL 99 103  CO2 24 23  GLUCOSE 146* 90  BUN 20 16  CREATININE 1.18 0.81  CALCIUM  8.5* 8.4*   Liver Function Tests: Recent Labs    06/22/24 1741  AST 38  ALT 18  ALKPHOS 58  BILITOT 1.3*  PROT 7.1  ALBUMIN 3.6   No results for input(s): LIPASE, AMYLASE in the last 72 hours. CBC: Recent Labs    06/22/24 1741 06/23/24 0414  WBC 9.4 8.8  NEUTROABS 6.2  --   HGB 12.5* 11.7*  HCT 37.9* 34.9*  MCV 96.9 94.8  PLT 272 246   Cardiac Enzymes: Recent Labs    06/22/24 1741 06/22/24 1948 06/22/24 2219  TROPONINIHS 15 223* 805*   BNP: No results for input(s): BNP in the last 72 hours. D-Dimer: No results for input(s): DDIMER in the last 72 hours. Hemoglobin A1C: No results for  input(s): HGBA1C in the last 72 hours. Fasting Lipid Panel: No results for input(s): CHOL, HDL, LDLCALC, TRIG, CHOLHDL, LDLDIRECT in the last 72 hours. Thyroid Function Tests: No results for input(s): TSH, T4TOTAL, T3FREE, THYROIDAB in the last 72 hours.  Invalid input(s): FREET3 Anemia Panel: No results for input(s): VITAMINB12, FOLATE, FERRITIN, TIBC, IRON, RETICCTPCT in the last 72 hours.   Radiology: DG Chest Portable 1 View Result Date: 06/22/2024 CLINICAL DATA:  Chest pain EXAM: PORTABLE CHEST 1 VIEW COMPARISON:  04/12/2024 FINDINGS: Prior CABG. Heart and mediastinal contours are within  normal limits. No focal opacities or effusions. No acute bony abnormality. IMPRESSION: No active disease. Electronically Signed   By: Franky Crease M.D.   On: 06/22/2024 18:13    ECHO ordered  TELEMETRY reviewed by me 06/23/2024: sinus rhythm rate 70s  EKG reviewed by me: none for review  Data reviewed by me 06/23/2024: last 24h vitals tele labs imaging I/O ED provider note, admission H&P  Principal Problem:   SVT (supraventricular tachycardia)    ASSESSMENT AND PLAN:  JACKY DROSS is a 83 y.o. male  with a past medical history of coronary artery disease s/p CABG 2001 (LIMA to LAD, LRA to Ramus, SVG to OM2, SVG to PDA), paroxysmal atrial fibrillation s/p multiple ablations most recently 03/2024, hypertension, hyperlipidemia who presented to the ED on 06/22/2024 for chest pressure, palpitations. Found to be in SVT by EMS and converted with adenosine . Troponins elevated. Cardiology was consulted for further evaluation.   # Supraventricular tachycardia # Chest pain # NSTEMI type I vs type II # Coronary artery disease s/p CABG x4 in 2001 # Hx atrial fibrillation/flutter s/p multiple ablations Patient with SVT per EMS and converted with adenosine . CP associated with palpitations and diaphoresis. Troponins trended 15 > 223 > 805. Remaining in NSR with resolution of  CP.  -Echo and nuclear stress test ordered for additional evaluation.  -Will check one additional troponin.  -Continue IV heparin .  -Continue home metoprolol  succinate 50 mg daily, ramipril  10 mg daily.  -S/p ASA 325 mg. Continue atorvastatin  80 mg daily and aspirin  81 mg daily.    TIMI Risk Score for Unstable Angina or Non-ST Elevation MI:   The patient's TIMI risk score is 4, which indicates a 20% risk of all cause mortality, new or recurrent myocardial infarction or need for urgent revascularization in the next 14 days.   This patient's plan of care was discussed and created with Dr. Crisoforo and he is in agreement.  Signed: Danita Bloch, PA-C  06/23/2024, 8:56 AM [facility] Cardiology

## 2024-06-23 NOTE — Plan of Care (Signed)
  Problem: Education: Goal: Understanding of cardiac disease, CV risk reduction, and recovery process will improve Outcome: Progressing Goal: Individualized Educational Video(s) Outcome: Progressing   Problem: Cardiac: Goal: Ability to achieve and maintain adequate cardiovascular perfusion will improve Outcome: Progressing   Problem: Health Behavior/Discharge Planning: Goal: Ability to safely manage health-related needs after discharge will improve Outcome: Progressing

## 2024-06-23 NOTE — Care Management CC44 (Signed)
 Condition Code 44 Documentation Completed  Patient Details  Name: TRAYSON STITELY MRN: [id number] Date of Birth: 1941/05/27   Condition Code 44 given:  Yes Patient signature on Condition Code 44 notice:  Yes Documentation of 2 MD's agreement:  Yes Code 44 added to claim:  Yes    Edsel DELENA Fischer, LCSW 06/23/2024, 5:27 PM

## 2024-06-24 LAB — LIPOPROTEIN A (LPA): Lipoprotein (a): 321.5 nmol/L — ABNORMAL HIGH (ref <75.0)

## 2024-07-19 ENCOUNTER — Emergency Department
Admission: EM | Admit: 2024-07-19 | Discharge: 2024-07-19 | Disposition: A | Discharge status: Home / Self Care | Attending: Emergency Medicine | Admitting: Emergency Medicine

## 2024-07-19 ENCOUNTER — Emergency Department

## 2024-07-19 ENCOUNTER — Other Ambulatory Visit: Payer: Self-pay

## 2024-07-19 DIAGNOSIS — Z7901 Long term (current) use of anticoagulants: Secondary | ICD-10-CM | POA: Diagnosis not present

## 2024-07-19 DIAGNOSIS — S60221A Contusion of right hand, initial encounter: Secondary | ICD-10-CM | POA: Insufficient documentation

## 2024-07-19 DIAGNOSIS — I2510 Atherosclerotic heart disease of native coronary artery without angina pectoris: Secondary | ICD-10-CM | POA: Insufficient documentation

## 2024-07-19 DIAGNOSIS — I4891 Unspecified atrial fibrillation: Secondary | ICD-10-CM | POA: Insufficient documentation

## 2024-07-19 DIAGNOSIS — Z23 Encounter for immunization: Secondary | ICD-10-CM | POA: Diagnosis not present

## 2024-07-19 DIAGNOSIS — S0121XA Laceration without foreign body of nose, initial encounter: Secondary | ICD-10-CM | POA: Insufficient documentation

## 2024-07-19 DIAGNOSIS — W101XXA Fall (on)(from) sidewalk curb, initial encounter: Secondary | ICD-10-CM | POA: Diagnosis not present

## 2024-07-19 DIAGNOSIS — W19XXXA Unspecified fall, initial encounter: Secondary | ICD-10-CM

## 2024-07-19 DIAGNOSIS — Y9301 Activity, walking, marching and hiking: Secondary | ICD-10-CM | POA: Diagnosis not present

## 2024-07-19 DIAGNOSIS — I10 Essential (primary) hypertension: Secondary | ICD-10-CM | POA: Diagnosis not present

## 2024-07-19 DIAGNOSIS — S0990XA Unspecified injury of head, initial encounter: Secondary | ICD-10-CM | POA: Insufficient documentation

## 2024-07-19 DIAGNOSIS — T07XXXA Unspecified multiple injuries, initial encounter: Secondary | ICD-10-CM

## 2024-07-19 DIAGNOSIS — M79641 Pain in right hand: Secondary | ICD-10-CM

## 2024-07-19 MED ORDER — TETANUS-DIPHTH-ACELL PERTUSSIS 5-2-15.5 LF-MCG/0.5 IM SUSP
0.5000 mL | Freq: Once | INTRAMUSCULAR | Status: AC
  Administered 2024-07-19: 0.5 mL via INTRAMUSCULAR
  Filled 2024-07-19: qty 0.5

## 2024-07-19 MED ORDER — AMOXICILLIN-POT CLAVULANATE 875-125 MG PO TABS
1.0000 | ORAL_TABLET | Freq: Once | ORAL | Status: AC
  Administered 2024-07-19: 1 via ORAL
  Filled 2024-07-19: qty 1

## 2024-07-19 MED ORDER — AMOXICILLIN-POT CLAVULANATE 875-125 MG PO TABS: 1.0000 | ORAL_TABLET | Freq: Two times a day (BID) | ORAL | 0 refills | Status: AC

## 2024-07-19 NOTE — ED Provider Notes (Signed)
 Kerry Graham Provider Note    Event Date/Time   First MD Initiated Contact with Patient 07/19/24 1952     (approximate)   History   Fall   HPI  Kerry Graham is a 83 y.o. male with history of atrial fibrillation on Eliquis , CAD, hypertension, hyperlipidemia, presenting with a fall.  He denies any LOC.  States that he was bringing food in, was wearing glasses, tripped on the curb and fell onto his face.  Also has some bruising to his right dorsal hand.  Unsure if he is up-to-date with his tetanus.  Denies any lightheadedness or preceding symptoms.  No pain anywhere else.  Per independent history from spouse, EMS was called, but patient refused transport.  He is ANO x 4 in no acute distress.       Physical Exam   Triage Vital Signs: ED Triage Vitals  Encounter Vitals Group     BP --      Girls Systolic BP Percentile --      Girls Diastolic BP Percentile --      Boys Systolic BP Percentile --      Boys Diastolic BP Percentile --      Pulse Rate 07/19/24 1951 69     Resp 07/19/24 1951 16     Temp 07/19/24 1951 98 F (36.7 C)     Temp Source 07/19/24 1951 Oral     SpO2 07/19/24 1951 100 %     Weight --      Height 07/19/24 1952 5' 7 (1.702 m)     Head Circumference --      Peak Flow --      Pain Score 07/19/24 1951 6     Pain Loc --      Pain Education --      Exclude from Growth Chart --     Most recent vital signs: Vitals:   07/19/24 1951  Pulse: 69  Resp: 16  Temp: 98 F (36.7 C)  SpO2: 100%     General: Awake, no distress.  CV:  Good peripheral perfusion.  Resp:  Normal effort.  No thoracic cage tenderness Abd:  No distention.  Soft nontender Other:  No midline spinal tenderness, no tenderness to his bilateral lower extremities and left upper extremity, he does have some mild tenderness to the dorsum of his right hand with some ecchymoses and abrasions, no snuffbox tenderness.  Full range of motion of all extremities are  intact, able to palpate radial pulses bilaterally.  No palpable skull deformities or tenderness, he does an abrasion to his forehead.  He does have a laceration to the bridge of his nose where the skin has avulsed.  No active bleeding at this time.  No nasal septal hematoma, no other intraoral lesions or tenderness to his face.   ED Results / Procedures / Treatments   Labs (all labs ordered are listed, but only abnormal results are displayed) Labs Reviewed - No data to display    RADIOLOGY On my independent interpretation, CT head without obvious intracranial hemorrhage   PROCEDURES:  Critical Care performed: No  Procedures   MEDICATIONS ORDERED IN ED: Medications  amoxicillin-clavulanate (AUGMENTIN) 875-125 MG per tablet 1 tablet (has no administration in time range)  Tdap (ADACEL) injection 0.5 mL (has no administration in time range)     IMPRESSION / MDM / ASSESSMENT AND PLAN / ED COURSE  I reviewed the triage vital signs and the nursing notes.  Differential diagnosis includes, but is not limited to, mechanical fall, he sustained an avulsion of his skin over his nose, fracture, contusion, strain, sprain, abrasion, intracranial hemorrhage.  Will get CT head, cervical spine, max face, x-ray hand.  Will give a tetanus here.  Patient's presentation is most consistent with acute presentation with potential threat to life or bodily function.  Independent interpretation of labs and imaging below.  CT shows nondisplaced bilateral nasal bone fractures, he does have a laceration noted to his anterior nose.  Will give him some antibiotics, first dose here.  His wound was irrigated, Xeroform placed and covered with gauze.  Considered but no indication for inpatient admission at this time, he is safe for outpatient management.  Shared decision making done with patient and wife and they are agreeable with this plan.  Will discharge with number to call for ENT on  Monday to schedule follow-up appointment.  Strict return precautions given.  Discharge.    Clinical Course as of 07/19/24 2117  [place] Jul 19, 2024  2043 DG Hand Complete Right IMPRESSION: 1. 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 2. No acute fracture.   [TT]  2057 CT Cervical Spine Wo Contrast 1. No acute abnormality of the cervical spine.  [TT]  2057 CT Head Wo Contrast 1. No acute intracranial abnormality.  [TT]  2059 CT Maxillofacial Wo Contrast IMPRESSION: 1. Nondisplaced bilateral nasal bone fractures. 2. Contusion and laceration about the anterior nose.   [TT]    Clinical Course User Index [TT] Waymond Lorelle Cummins, MD     FINAL CLINICAL IMPRESSION(S) / ED DIAGNOSES   Final diagnoses:  Fall, initial encounter  Abrasions of multiple sites  Pain of right hand  Laceration of nose, initial encounter     Rx / DC Orders   ED Discharge Orders          Ordered    amoxicillin-clavulanate (AUGMENTIN) 875-125 MG tablet  2 times daily        07/19/24 2112             Note:  This document was prepared using Dragon voice recognition software and may include unintentional dictation errors.    Waymond Lorelle Cummins, MD 07/19/24 2117

## 2024-07-19 NOTE — ED Triage Notes (Addendum)
 Pt to ed from home via POV for a fall. EMS came and evaluated but pt refused transport. Pt is caox4, in no acute distress. Pt denies LOC. Pt is on thinners. Pt was walking and missed the curb, while carrying something and fell face forward. Pt has bleeding to the bridge of the nose.

## 2024-07-19 NOTE — Discharge Instructions (Addendum)
 Please follow-up with ENT for your nasal bone fracture.  Please make sure to take the antibiotics as prescribed.  You see any redness or purulent drainage to the laceration site, please return to be reevaluated.  Please keep the laceration area clean and dry, please do not soak your face in any body of water until it completely heals up.

## 2024-07-19 NOTE — ED Notes (Signed)
 Pt discharged to home, instructions and medications reviewed with pt and family.  All parties verbalized understanding, no questions at this time.
